# Patient Record
Sex: Male | Born: 1935 | Race: White | Hispanic: No
Health system: Southern US, Community
[De-identification: ages and names within clinical notes are randomized; demographics above are authoritative.]

---

## 2017-08-31 ENCOUNTER — Other Ambulatory Visit: Payer: Self-pay | Admitting: Physician Assistant

## 2017-08-31 DIAGNOSIS — M5136 Other intervertebral disc degeneration, lumbar region: Secondary | ICD-10-CM

## 2017-08-31 DIAGNOSIS — M48061 Spinal stenosis, lumbar region without neurogenic claudication: Secondary | ICD-10-CM

## 2017-09-06 ENCOUNTER — Encounter (HOSPITAL_COMMUNITY): Payer: Self-pay

## 2017-09-06 ENCOUNTER — Ambulatory Visit (HOSPITAL_COMMUNITY): Payer: Self-pay

## 2017-09-11 ENCOUNTER — Ambulatory Visit
Admission: RE | Admit: 2017-09-11 | Discharge: 2017-09-11 | Disposition: A | Discharge status: Home / Self Care | Payer: Medicare PPO | Source: Ambulatory Visit | Attending: Physician Assistant | Admitting: Physician Assistant

## 2017-09-11 DIAGNOSIS — M5136 Other intervertebral disc degeneration, lumbar region: Secondary | ICD-10-CM

## 2017-09-11 DIAGNOSIS — M48061 Spinal stenosis, lumbar region without neurogenic claudication: Secondary | ICD-10-CM

## 2019-01-11 DIAGNOSIS — R232 Flushing: Secondary | ICD-10-CM | POA: Diagnosis not present

## 2019-01-11 DIAGNOSIS — C61 Malignant neoplasm of prostate: Secondary | ICD-10-CM | POA: Diagnosis not present

## 2019-01-11 DIAGNOSIS — E559 Vitamin D deficiency, unspecified: Secondary | ICD-10-CM | POA: Diagnosis not present

## 2019-04-12 DIAGNOSIS — M818 Other osteoporosis without current pathological fracture: Secondary | ICD-10-CM | POA: Diagnosis not present

## 2019-04-12 DIAGNOSIS — E559 Vitamin D deficiency, unspecified: Secondary | ICD-10-CM | POA: Diagnosis not present

## 2019-04-12 DIAGNOSIS — C61 Malignant neoplasm of prostate: Secondary | ICD-10-CM | POA: Diagnosis not present

## 2019-05-05 DIAGNOSIS — Z23 Encounter for immunization: Secondary | ICD-10-CM | POA: Diagnosis not present

## 2019-05-31 DIAGNOSIS — Z0000 Encounter for general adult medical examination without abnormal findings: Secondary | ICD-10-CM | POA: Diagnosis not present

## 2019-05-31 DIAGNOSIS — E785 Hyperlipidemia, unspecified: Secondary | ICD-10-CM | POA: Diagnosis not present

## 2019-05-31 DIAGNOSIS — Z8546 Personal history of malignant neoplasm of prostate: Secondary | ICD-10-CM | POA: Diagnosis not present

## 2019-05-31 DIAGNOSIS — E039 Hypothyroidism, unspecified: Secondary | ICD-10-CM | POA: Diagnosis not present

## 2019-07-12 DIAGNOSIS — M818 Other osteoporosis without current pathological fracture: Secondary | ICD-10-CM | POA: Diagnosis not present

## 2019-07-12 DIAGNOSIS — E559 Vitamin D deficiency, unspecified: Secondary | ICD-10-CM | POA: Diagnosis not present

## 2019-07-12 DIAGNOSIS — C61 Malignant neoplasm of prostate: Secondary | ICD-10-CM | POA: Diagnosis not present

## 2019-09-05 ENCOUNTER — Telehealth: Payer: Self-pay

## 2019-09-05 NOTE — Telephone Encounter (Signed)
Contacted pt; explained that appts could not be traded; pt transferred to [facility] Agent Nelva Bush to place his wife on wait list.

## 2019-09-05 NOTE — Telephone Encounter (Signed)
Pt called to verify his app for covid vaccine. His wife does not yet have an app and he would like to give his spot to his wife because he feels that she needs it more than he does. Will have nurse to call him back.   Joycelyn Rua Hopkins

## 2019-09-07 ENCOUNTER — Ambulatory Visit: Payer: Medicare PPO | Attending: Internal Medicine

## 2019-09-07 DIAGNOSIS — Z23 Encounter for immunization: Secondary | ICD-10-CM | POA: Insufficient documentation

## 2019-09-07 NOTE — Progress Notes (Signed)
   Covid-19 Vaccination Clinic  Name:  Eric Riley    MRN: [id number] DOB: [birth year]  09/07/2019  Eric Riley was observed post Covid-19 immunization for 15 minutes without incidence. He was provided with Vaccine Information Sheet and instruction to access the V-Safe system.   Eric Riley was instructed to call 911 with any severe reactions post vaccine: Marland Kitchen Difficulty breathing  . Swelling of your face and throat  . A fast heartbeat  . A bad rash all over your body  . Dizziness and weakness    Immunizations Administered    Name Date Dose VIS Date Route   Pfizer COVID-19 Vaccine 09/07/2019  4:06 PM 0.3 mL 07/07/2019 Intramuscular   Manufacturer: [employer], [employer]   Lot: [id number]   NDC: [address]-1

## 2019-10-02 ENCOUNTER — Ambulatory Visit: Payer: Medicare PPO | Attending: Internal Medicine

## 2019-10-02 DIAGNOSIS — Z23 Encounter for immunization: Secondary | ICD-10-CM | POA: Insufficient documentation

## 2019-10-02 NOTE — Progress Notes (Signed)
   Covid-19 Vaccination Clinic  Name:  CHRISEAN KLOTH    MRN: [id number] DOB: 07-25-1936  10/02/2019  Mr. Franca was observed post Covid-19 immunization for 15 minutes without incident. He was provided with Vaccine Information Sheet and instruction to access the V-Safe system.   Mr. Mazzuca was instructed to call 911 with any severe reactions post vaccine: Marland Kitchen Difficulty breathing  . Swelling of face and throat  . A fast heartbeat  . A bad rash all over body  . Dizziness and weakness   Immunizations Administered    Name Date Dose VIS Date Route   Pfizer COVID-19 Vaccine 10/02/2019 12:37 PM 0.3 mL 07/07/2019 Intramuscular   Manufacturer: [employer], [employer]   Lot: [id number]   NDC: [address]-0

## 2019-10-11 DIAGNOSIS — C7951 Secondary malignant neoplasm of bone: Secondary | ICD-10-CM | POA: Diagnosis not present

## 2019-10-11 DIAGNOSIS — Z192 Hormone resistant malignancy status: Secondary | ICD-10-CM | POA: Diagnosis not present

## 2019-10-11 DIAGNOSIS — T387X5A Adverse effect of androgens and anabolic congeners, initial encounter: Secondary | ICD-10-CM | POA: Diagnosis not present

## 2019-10-11 DIAGNOSIS — M818 Other osteoporosis without current pathological fracture: Secondary | ICD-10-CM | POA: Diagnosis not present

## 2019-10-11 DIAGNOSIS — C61 Malignant neoplasm of prostate: Secondary | ICD-10-CM | POA: Diagnosis not present

## 2019-10-11 DIAGNOSIS — Z9079 Acquired absence of other genital organ(s): Secondary | ICD-10-CM | POA: Diagnosis not present

## 2019-10-11 DIAGNOSIS — E559 Vitamin D deficiency, unspecified: Secondary | ICD-10-CM | POA: Diagnosis not present

## 2019-10-11 DIAGNOSIS — E876 Hypokalemia: Secondary | ICD-10-CM | POA: Diagnosis not present

## 2019-10-17 DIAGNOSIS — M5430 Sciatica, unspecified side: Secondary | ICD-10-CM | POA: Diagnosis not present

## 2019-10-17 DIAGNOSIS — R29898 Other symptoms and signs involving the musculoskeletal system: Secondary | ICD-10-CM | POA: Diagnosis not present

## 2019-10-17 DIAGNOSIS — I10 Essential (primary) hypertension: Secondary | ICD-10-CM | POA: Diagnosis not present

## 2019-10-20 ENCOUNTER — Other Ambulatory Visit: Payer: Self-pay | Admitting: Family Medicine

## 2019-10-20 DIAGNOSIS — M5430 Sciatica, unspecified side: Secondary | ICD-10-CM

## 2019-11-09 ENCOUNTER — Ambulatory Visit
Admission: RE | Admit: 2019-11-09 | Discharge: 2019-11-09 | Disposition: A | Discharge status: Home / Self Care | Payer: Medicare PPO | Source: Ambulatory Visit | Attending: Family Medicine | Admitting: Family Medicine

## 2019-11-09 DIAGNOSIS — M47816 Spondylosis without myelopathy or radiculopathy, lumbar region: Secondary | ICD-10-CM | POA: Diagnosis not present

## 2019-11-09 DIAGNOSIS — M5430 Sciatica, unspecified side: Secondary | ICD-10-CM

## 2019-11-09 MED ORDER — GADOBENATE DIMEGLUMINE 529 MG/ML IV SOLN
15.0000 mL | Freq: Once | INTRAVENOUS | Status: AC | PRN
  Administered 2019-11-09: 15 mL via INTRAVENOUS

## 2019-12-06 DIAGNOSIS — C61 Malignant neoplasm of prostate: Secondary | ICD-10-CM | POA: Diagnosis not present

## 2019-12-06 DIAGNOSIS — E039 Hypothyroidism, unspecified: Secondary | ICD-10-CM | POA: Diagnosis not present

## 2019-12-06 DIAGNOSIS — M8580 Other specified disorders of bone density and structure, unspecified site: Secondary | ICD-10-CM | POA: Diagnosis not present

## 2019-12-06 DIAGNOSIS — I10 Essential (primary) hypertension: Secondary | ICD-10-CM | POA: Diagnosis not present

## 2019-12-06 DIAGNOSIS — M5430 Sciatica, unspecified side: Secondary | ICD-10-CM | POA: Diagnosis not present

## 2020-01-02 DIAGNOSIS — M48062 Spinal stenosis, lumbar region with neurogenic claudication: Secondary | ICD-10-CM | POA: Diagnosis not present

## 2020-01-02 DIAGNOSIS — Z1159 Encounter for screening for other viral diseases: Secondary | ICD-10-CM | POA: Diagnosis not present

## 2020-01-02 DIAGNOSIS — Z9889 Other specified postprocedural states: Secondary | ICD-10-CM | POA: Diagnosis not present

## 2020-01-02 DIAGNOSIS — E039 Hypothyroidism, unspecified: Secondary | ICD-10-CM | POA: Diagnosis not present

## 2020-01-02 DIAGNOSIS — I959 Hypotension, unspecified: Secondary | ICD-10-CM | POA: Diagnosis not present

## 2020-01-02 DIAGNOSIS — M545 Low back pain: Secondary | ICD-10-CM | POA: Diagnosis not present

## 2020-01-02 DIAGNOSIS — M4316 Spondylolisthesis, lumbar region: Secondary | ICD-10-CM | POA: Diagnosis not present

## 2020-01-02 DIAGNOSIS — M48061 Spinal stenosis, lumbar region without neurogenic claudication: Secondary | ICD-10-CM | POA: Diagnosis not present

## 2020-01-02 DIAGNOSIS — M5137 Other intervertebral disc degeneration, lumbosacral region: Secondary | ICD-10-CM | POA: Diagnosis not present

## 2020-01-02 DIAGNOSIS — E559 Vitamin D deficiency, unspecified: Secondary | ICD-10-CM | POA: Diagnosis not present

## 2020-01-02 DIAGNOSIS — C61 Malignant neoplasm of prostate: Secondary | ICD-10-CM | POA: Diagnosis not present

## 2020-01-02 DIAGNOSIS — M47816 Spondylosis without myelopathy or radiculopathy, lumbar region: Secondary | ICD-10-CM | POA: Diagnosis not present

## 2020-01-05 DIAGNOSIS — I959 Hypotension, unspecified: Secondary | ICD-10-CM | POA: Diagnosis not present

## 2020-01-05 DIAGNOSIS — E039 Hypothyroidism, unspecified: Secondary | ICD-10-CM | POA: Diagnosis not present

## 2020-01-29 DIAGNOSIS — Z20822 Contact with and (suspected) exposure to covid-19: Secondary | ICD-10-CM | POA: Diagnosis not present

## 2020-01-31 DIAGNOSIS — M8580 Other specified disorders of bone density and structure, unspecified site: Secondary | ICD-10-CM | POA: Diagnosis not present

## 2020-01-31 DIAGNOSIS — Z79899 Other long term (current) drug therapy: Secondary | ICD-10-CM | POA: Diagnosis not present

## 2020-01-31 DIAGNOSIS — G9741 Accidental puncture or laceration of dura during a procedure: Secondary | ICD-10-CM | POA: Diagnosis not present

## 2020-01-31 DIAGNOSIS — Z596 Low income: Secondary | ICD-10-CM | POA: Diagnosis not present

## 2020-01-31 DIAGNOSIS — Z9079 Acquired absence of other genital organ(s): Secondary | ICD-10-CM | POA: Diagnosis not present

## 2020-01-31 DIAGNOSIS — E876 Hypokalemia: Secondary | ICD-10-CM | POA: Diagnosis not present

## 2020-01-31 DIAGNOSIS — M48061 Spinal stenosis, lumbar region without neurogenic claudication: Secondary | ICD-10-CM | POA: Diagnosis not present

## 2020-01-31 DIAGNOSIS — M48062 Spinal stenosis, lumbar region with neurogenic claudication: Secondary | ICD-10-CM | POA: Diagnosis not present

## 2020-01-31 DIAGNOSIS — Z8546 Personal history of malignant neoplasm of prostate: Secondary | ICD-10-CM | POA: Diagnosis not present

## 2020-01-31 DIAGNOSIS — E039 Hypothyroidism, unspecified: Secondary | ICD-10-CM | POA: Diagnosis not present

## 2020-01-31 DIAGNOSIS — Z7952 Long term (current) use of systemic steroids: Secondary | ICD-10-CM | POA: Diagnosis not present

## 2020-02-01 DIAGNOSIS — Z79899 Other long term (current) drug therapy: Secondary | ICD-10-CM | POA: Diagnosis not present

## 2020-02-01 DIAGNOSIS — E039 Hypothyroidism, unspecified: Secondary | ICD-10-CM | POA: Diagnosis not present

## 2020-02-01 DIAGNOSIS — M48062 Spinal stenosis, lumbar region with neurogenic claudication: Secondary | ICD-10-CM | POA: Diagnosis not present

## 2020-02-01 DIAGNOSIS — Z7952 Long term (current) use of systemic steroids: Secondary | ICD-10-CM | POA: Diagnosis not present

## 2020-02-01 DIAGNOSIS — M8580 Other specified disorders of bone density and structure, unspecified site: Secondary | ICD-10-CM | POA: Diagnosis not present

## 2020-02-01 DIAGNOSIS — Z596 Low income: Secondary | ICD-10-CM | POA: Diagnosis not present

## 2020-02-01 DIAGNOSIS — Z8546 Personal history of malignant neoplasm of prostate: Secondary | ICD-10-CM | POA: Diagnosis not present

## 2020-02-01 DIAGNOSIS — Z9079 Acquired absence of other genital organ(s): Secondary | ICD-10-CM | POA: Diagnosis not present

## 2020-02-01 DIAGNOSIS — E876 Hypokalemia: Secondary | ICD-10-CM | POA: Diagnosis not present

## 2020-03-06 DIAGNOSIS — E039 Hypothyroidism, unspecified: Secondary | ICD-10-CM | POA: Diagnosis not present

## 2020-04-03 DIAGNOSIS — E039 Hypothyroidism, unspecified: Secondary | ICD-10-CM | POA: Diagnosis not present

## 2020-04-03 DIAGNOSIS — E876 Hypokalemia: Secondary | ICD-10-CM | POA: Diagnosis not present

## 2020-04-03 DIAGNOSIS — C7951 Secondary malignant neoplasm of bone: Secondary | ICD-10-CM | POA: Diagnosis not present

## 2020-04-03 DIAGNOSIS — M818 Other osteoporosis without current pathological fracture: Secondary | ICD-10-CM | POA: Diagnosis not present

## 2020-04-03 DIAGNOSIS — T387X5A Adverse effect of androgens and anabolic congeners, initial encounter: Secondary | ICD-10-CM | POA: Diagnosis not present

## 2020-04-03 DIAGNOSIS — C61 Malignant neoplasm of prostate: Secondary | ICD-10-CM | POA: Diagnosis not present

## 2020-04-03 DIAGNOSIS — E559 Vitamin D deficiency, unspecified: Secondary | ICD-10-CM | POA: Diagnosis not present

## 2020-04-03 DIAGNOSIS — Z5112 Encounter for antineoplastic immunotherapy: Secondary | ICD-10-CM | POA: Diagnosis not present

## 2020-04-03 DIAGNOSIS — Z191 Hormone sensitive malignancy status: Secondary | ICD-10-CM | POA: Diagnosis not present

## 2020-04-25 DIAGNOSIS — Z23 Encounter for immunization: Secondary | ICD-10-CM | POA: Diagnosis not present

## 2020-06-28 IMAGING — MR MR LUMBAR SPINE WO/W CM
4 of 7 series · 26 of 48 positions shown · IV contrast (Multihance)
Comparison: 09/11/2017

CLINICAL DATA: Low back pain with right-sided radiculopathy.
History of prostate cancer.

EXAM:
MRI LUMBAR SPINE WITHOUT AND WITH CONTRAST
TECHNIQUE: Multiplanar and multiecho pulse sequences of the lumbar spine were
obtained without and with intravenous contrast.
CONTRAST:  15mL MULTIHANCE GADOBENATE DIMEGLUMINE 529 MG/ML IV SOLN

[Series 2: T2 · sagittal · 4.0mm · 0.59mm/px · 3 of 16 slices shown (1 of 2)]
[im 1/16]
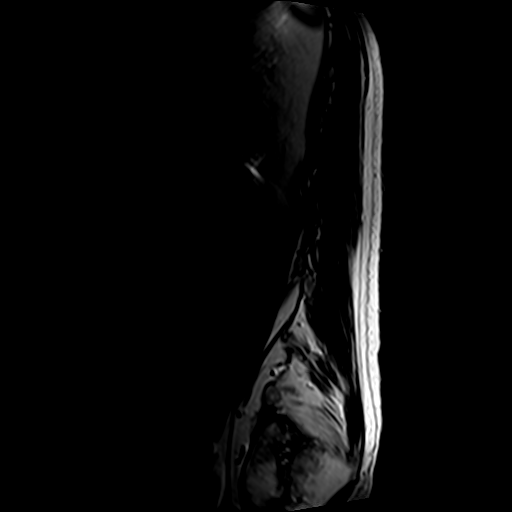
[im 8/16]
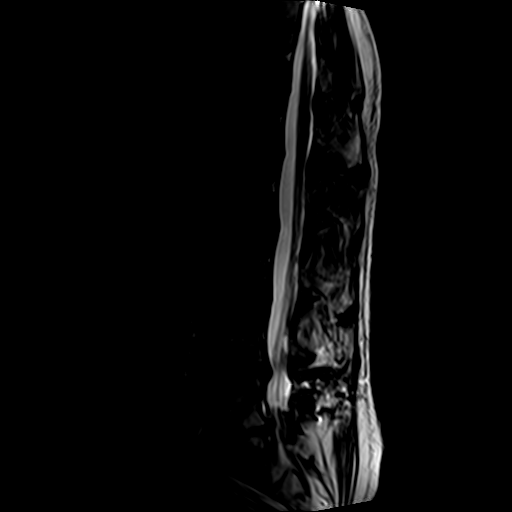
[im 16/16]
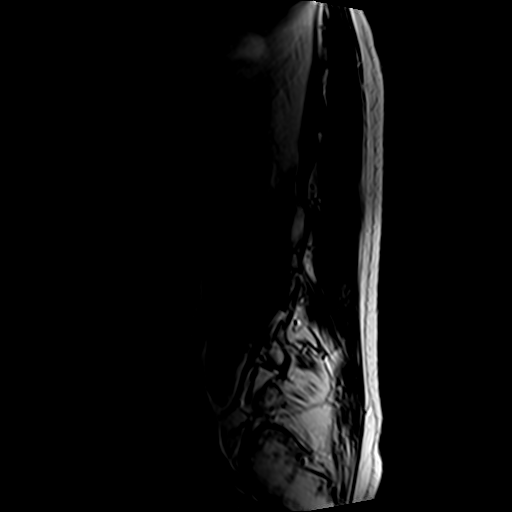

[Series 4: T1 · sagittal · 4.0mm · 0.59mm/px · 4 of 16 slices shown (1 of 2)]
[im 1/16]
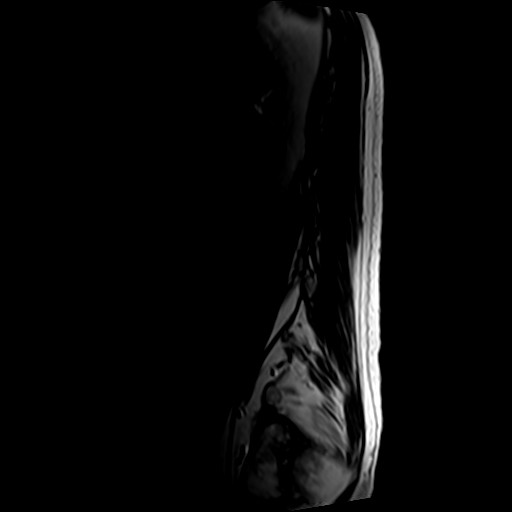
[im 6/16]
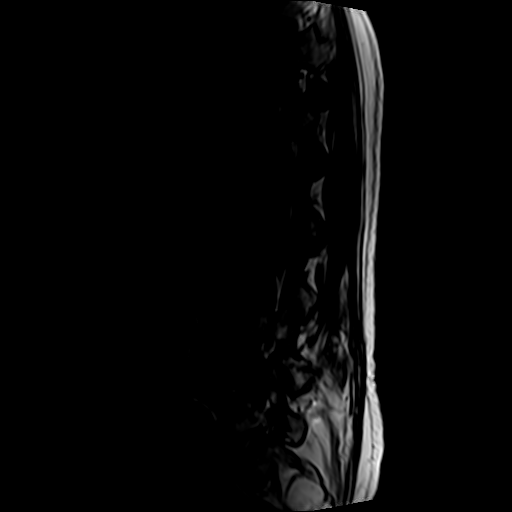
[im 11/16]
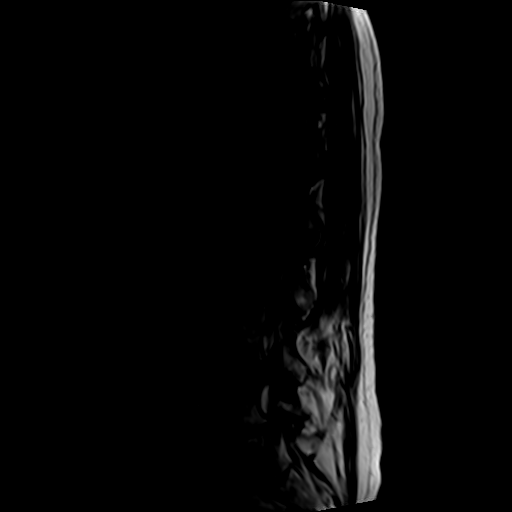
[im 16/16]
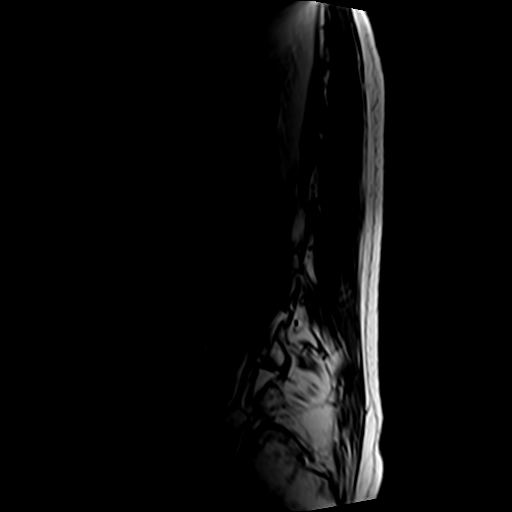

[Series 5: T2 · axial · 4.0mm · 0.70mm/px · z∈[-37,+205]mm · 11 of 43 slices shown (2 of 2)]
[im 1/43]
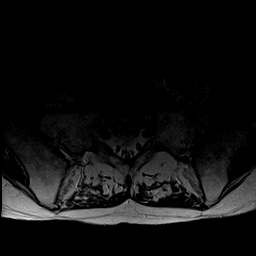
[im 5/43]
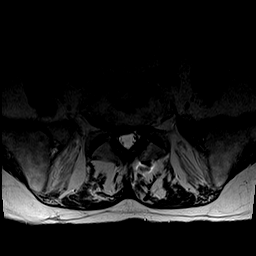
[im 9/43]
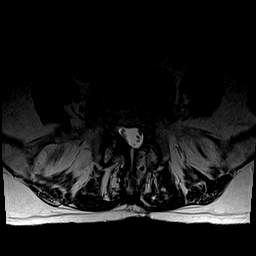
[im 13/43]
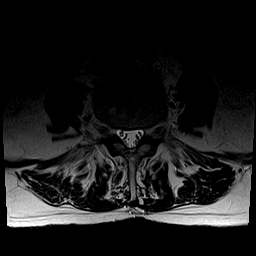
[im 17/43]
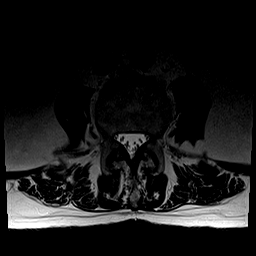
[im 22/43]
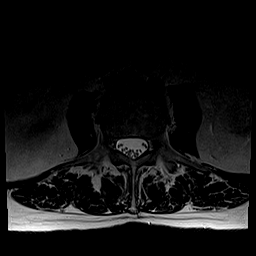
[im 26/43]
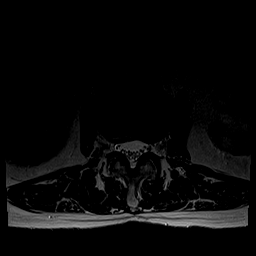
[im 30/43]
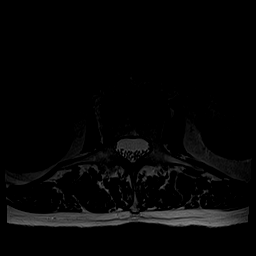
[im 34/43]
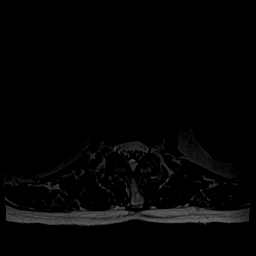
[im 38/43]
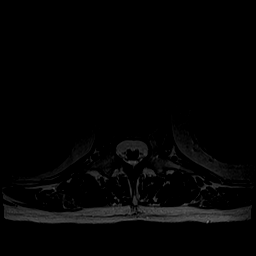
[im 43/43]
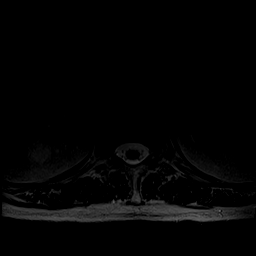

[Series 6: T1 · axial · 4.0mm · 0.35mm/px · z∈[-37,+179]mm · 8 of 43 slices shown (2 of 2)]
[im 1/43]
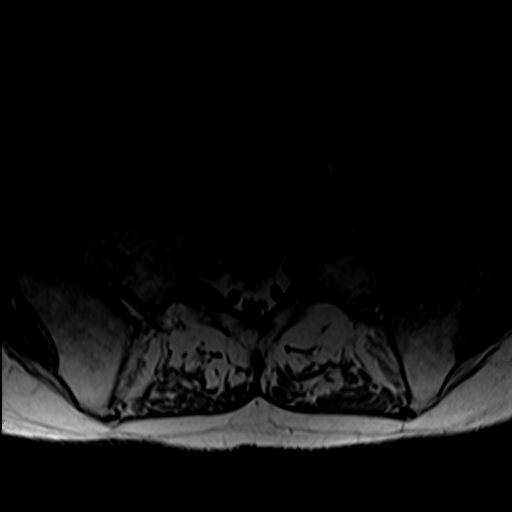
[im 5/43]
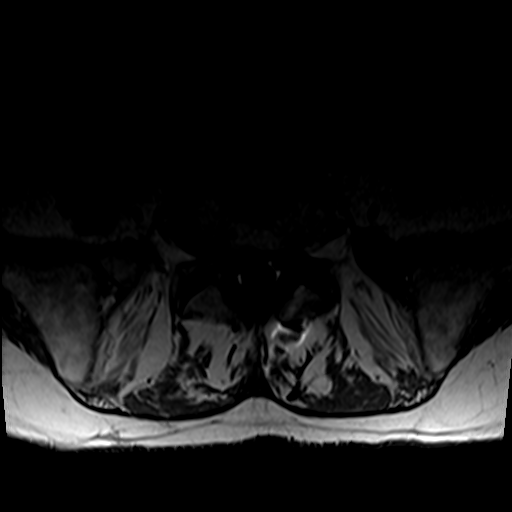
[im 9/43]
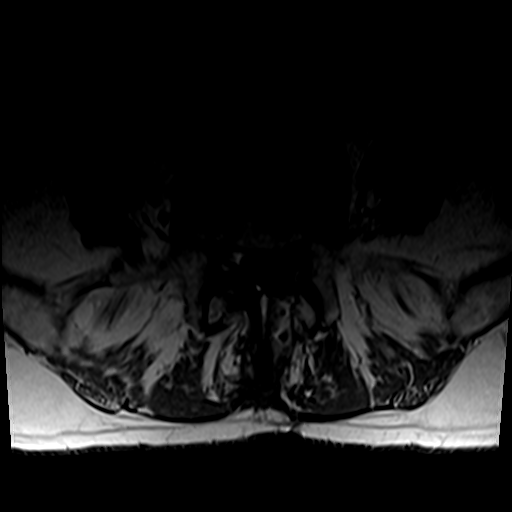
[im 13/43]
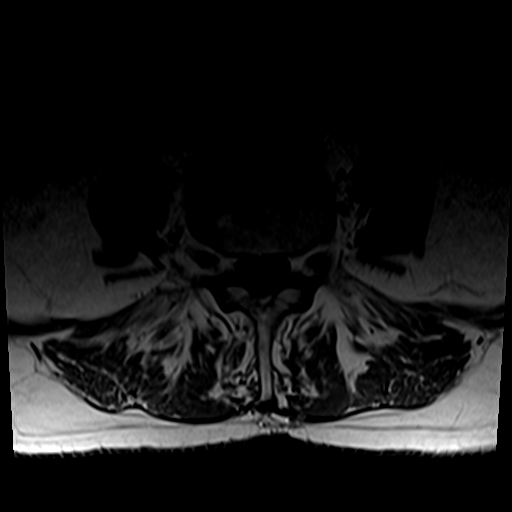
[im 17/43]
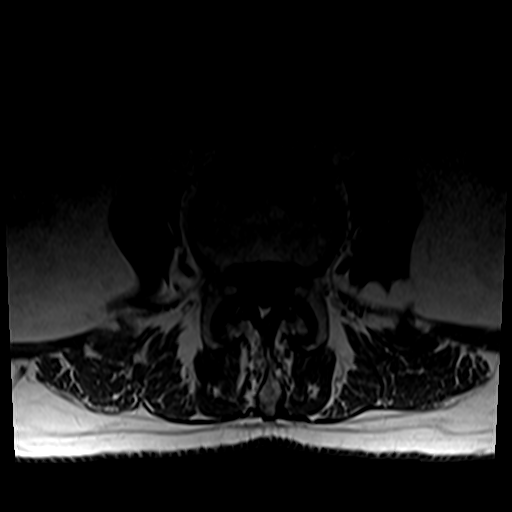
[im 22/43]
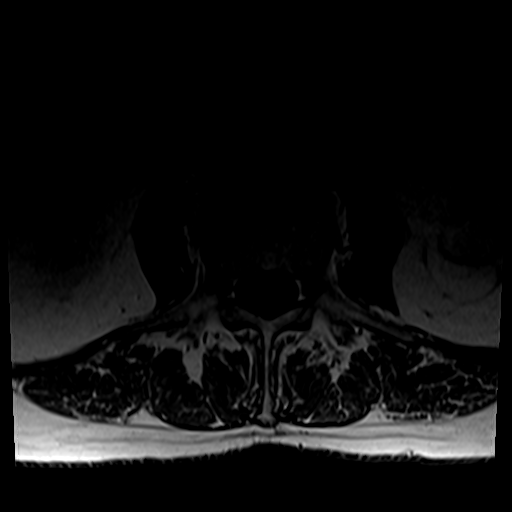
[im 26/43]
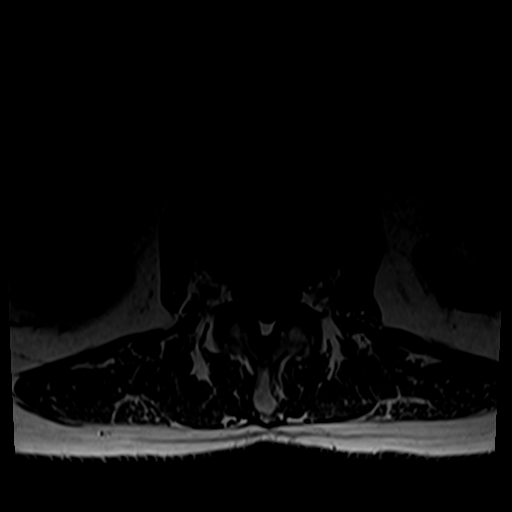
[im 38/43]
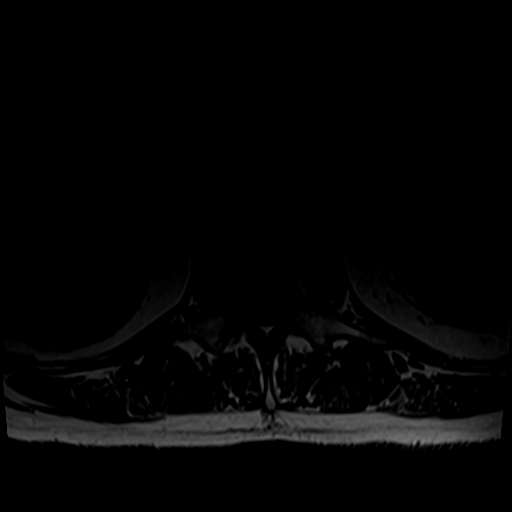

[26 of 48 positions shown; findings below may reference images not displayed]

FINDINGS: Segmentation:  Standard.

Alignment:  Trace retrolisthesis L5 on S1.

Vertebrae: No fracture, evidence of discitis, or suspicious marrow
replacing bone lesion.

Conus medullaris and cauda equina: Conus extends to the L1-2 level.
Conus and cauda equina appear normal.

Paraspinal and other soft tissues: Cortically based T2 hyperintense
lesionswithin the bilateral kidneys, incompletely characterized, but
most likely represent cysts.

Disc levels:

T12-L1: No significant disc protrusion, foraminal stenosis, or canal
stenosis.

L1-L2: Small right paracentral disc protrusion. No foraminal or
canal stenosis. Unchanged.

L2-L3: Small right paracentral disc protrusion. Mild bilateral facet
arthropathy. No foraminal or canal stenosis. Unchanged.

L3-L4: Minimal disc bulge. Bilateral facet arthropathy. No foraminal
or canal stenosis. Unchanged.

L4-L5: Prior left laminectomy. Diffuse disc bulge with bilateral
facet arthropathy result in moderate canal stenosis and moderate to
severe bilateral subarticular recess stenosis. Bilateral foramina
remain patent. Previously seen left subarticular facet synovial cyst
is no longer seen. The remaining findings at this level are
unchanged from prior.

L5-S1: Advanced disc height loss with disc bulge and osteophytic
endplate spurring. Mild bilateral facet arthrosis. Moderate to
severe right subarticular recess stenosis. Mild left subarticular
recess stenosis. No canal or foraminal stenosis. Unchanged.
IMPRESSION: 1. No significant interval progression of multilevel lumbar
spondylosis. Findings most pronounced at L4-L5 with moderate canal
stenosis and moderate-to-severe bilateral subarticular recess
stenosis impinging the bilateral L5 nerve roots. Previously seen
left L4-5 subarticular facet synovial cyst is no longer seen.
2. Similar moderate-to-severe right subarticular recess stenosis at
L5-S1, likely impacting the right S1 nerve root.

## 2020-07-10 DIAGNOSIS — E559 Vitamin D deficiency, unspecified: Secondary | ICD-10-CM | POA: Diagnosis not present

## 2020-07-10 DIAGNOSIS — C61 Malignant neoplasm of prostate: Secondary | ICD-10-CM | POA: Diagnosis not present

## 2020-09-17 DIAGNOSIS — C61 Malignant neoplasm of prostate: Secondary | ICD-10-CM | POA: Diagnosis not present

## 2020-09-30 DIAGNOSIS — R972 Elevated prostate specific antigen [PSA]: Secondary | ICD-10-CM | POA: Diagnosis not present

## 2020-09-30 DIAGNOSIS — N320 Bladder-neck obstruction: Secondary | ICD-10-CM | POA: Diagnosis not present

## 2020-09-30 DIAGNOSIS — C7951 Secondary malignant neoplasm of bone: Secondary | ICD-10-CM | POA: Diagnosis not present

## 2020-09-30 DIAGNOSIS — Z5112 Encounter for antineoplastic immunotherapy: Secondary | ICD-10-CM | POA: Diagnosis not present

## 2020-09-30 DIAGNOSIS — Q873 Congenital malformation syndromes involving early overgrowth: Secondary | ICD-10-CM | POA: Diagnosis not present

## 2020-09-30 DIAGNOSIS — C61 Malignant neoplasm of prostate: Secondary | ICD-10-CM | POA: Diagnosis not present

## 2020-09-30 DIAGNOSIS — R030 Elevated blood-pressure reading, without diagnosis of hypertension: Secondary | ICD-10-CM | POA: Diagnosis not present

## 2020-09-30 DIAGNOSIS — E876 Hypokalemia: Secondary | ICD-10-CM | POA: Diagnosis not present

## 2020-09-30 DIAGNOSIS — M810 Age-related osteoporosis without current pathological fracture: Secondary | ICD-10-CM | POA: Diagnosis not present

## 2020-10-09 DIAGNOSIS — Z79899 Other long term (current) drug therapy: Secondary | ICD-10-CM | POA: Diagnosis not present

## 2020-10-09 DIAGNOSIS — M810 Age-related osteoporosis without current pathological fracture: Secondary | ICD-10-CM | POA: Diagnosis not present

## 2020-10-09 DIAGNOSIS — C61 Malignant neoplasm of prostate: Secondary | ICD-10-CM | POA: Diagnosis not present

## 2020-10-09 DIAGNOSIS — Z8042 Family history of malignant neoplasm of prostate: Secondary | ICD-10-CM | POA: Diagnosis not present

## 2020-10-09 DIAGNOSIS — Z800 Family history of malignant neoplasm of digestive organs: Secondary | ICD-10-CM | POA: Diagnosis not present

## 2020-10-09 DIAGNOSIS — M818 Other osteoporosis without current pathological fracture: Secondary | ICD-10-CM | POA: Diagnosis not present

## 2020-10-09 DIAGNOSIS — T387X5A Adverse effect of androgens and anabolic congeners, initial encounter: Secondary | ICD-10-CM | POA: Diagnosis not present

## 2020-10-09 DIAGNOSIS — Z9079 Acquired absence of other genital organ(s): Secondary | ICD-10-CM | POA: Diagnosis not present

## 2020-10-09 DIAGNOSIS — Z7952 Long term (current) use of systemic steroids: Secondary | ICD-10-CM | POA: Diagnosis not present

## 2020-10-09 DIAGNOSIS — Z791 Long term (current) use of non-steroidal anti-inflammatories (NSAID): Secondary | ICD-10-CM | POA: Diagnosis not present

## 2020-10-09 DIAGNOSIS — E876 Hypokalemia: Secondary | ICD-10-CM | POA: Diagnosis not present

## 2020-10-14 DIAGNOSIS — Z79899 Other long term (current) drug therapy: Secondary | ICD-10-CM | POA: Diagnosis not present

## 2020-10-14 DIAGNOSIS — C61 Malignant neoplasm of prostate: Secondary | ICD-10-CM | POA: Diagnosis not present

## 2020-10-14 DIAGNOSIS — C7951 Secondary malignant neoplasm of bone: Secondary | ICD-10-CM | POA: Diagnosis not present

## 2020-11-14 DIAGNOSIS — Z79899 Other long term (current) drug therapy: Secondary | ICD-10-CM | POA: Diagnosis not present

## 2020-11-14 DIAGNOSIS — C61 Malignant neoplasm of prostate: Secondary | ICD-10-CM | POA: Diagnosis not present

## 2020-11-14 DIAGNOSIS — R030 Elevated blood-pressure reading, without diagnosis of hypertension: Secondary | ICD-10-CM | POA: Diagnosis not present

## 2020-11-14 DIAGNOSIS — R5381 Other malaise: Secondary | ICD-10-CM | POA: Diagnosis not present

## 2020-11-14 DIAGNOSIS — C7951 Secondary malignant neoplasm of bone: Secondary | ICD-10-CM | POA: Diagnosis not present

## 2020-11-14 DIAGNOSIS — M810 Age-related osteoporosis without current pathological fracture: Secondary | ICD-10-CM | POA: Diagnosis not present

## 2020-11-14 DIAGNOSIS — R519 Headache, unspecified: Secondary | ICD-10-CM | POA: Diagnosis not present

## 2020-11-14 DIAGNOSIS — R5383 Other fatigue: Secondary | ICD-10-CM | POA: Diagnosis not present

## 2020-11-14 DIAGNOSIS — E876 Hypokalemia: Secondary | ICD-10-CM | POA: Diagnosis not present

## 2020-11-14 DIAGNOSIS — M7989 Other specified soft tissue disorders: Secondary | ICD-10-CM | POA: Diagnosis not present

## 2020-11-14 DIAGNOSIS — Z79818 Long term (current) use of other agents affecting estrogen receptors and estrogen levels: Secondary | ICD-10-CM | POA: Diagnosis not present

## 2021-01-08 DIAGNOSIS — E039 Hypothyroidism, unspecified: Secondary | ICD-10-CM | POA: Diagnosis not present

## 2021-01-08 DIAGNOSIS — R310 Gross hematuria: Secondary | ICD-10-CM | POA: Diagnosis not present

## 2021-01-08 DIAGNOSIS — C61 Malignant neoplasm of prostate: Secondary | ICD-10-CM | POA: Diagnosis not present

## 2021-01-08 DIAGNOSIS — E559 Vitamin D deficiency, unspecified: Secondary | ICD-10-CM | POA: Diagnosis not present

## 2021-02-01 DIAGNOSIS — C61 Malignant neoplasm of prostate: Secondary | ICD-10-CM | POA: Diagnosis not present

## 2021-02-13 DIAGNOSIS — C7951 Secondary malignant neoplasm of bone: Secondary | ICD-10-CM | POA: Diagnosis not present

## 2021-02-13 DIAGNOSIS — R399 Unspecified symptoms and signs involving the genitourinary system: Secondary | ICD-10-CM | POA: Diagnosis not present

## 2021-02-13 DIAGNOSIS — R319 Hematuria, unspecified: Secondary | ICD-10-CM | POA: Diagnosis not present

## 2021-02-13 DIAGNOSIS — C61 Malignant neoplasm of prostate: Secondary | ICD-10-CM | POA: Diagnosis not present

## 2021-03-28 DIAGNOSIS — Z0000 Encounter for general adult medical examination without abnormal findings: Secondary | ICD-10-CM | POA: Diagnosis not present

## 2021-03-28 DIAGNOSIS — C61 Malignant neoplasm of prostate: Secondary | ICD-10-CM | POA: Diagnosis not present

## 2021-03-28 DIAGNOSIS — I959 Hypotension, unspecified: Secondary | ICD-10-CM | POA: Diagnosis not present

## 2021-03-28 DIAGNOSIS — E785 Hyperlipidemia, unspecified: Secondary | ICD-10-CM | POA: Diagnosis not present

## 2021-03-28 DIAGNOSIS — D649 Anemia, unspecified: Secondary | ICD-10-CM | POA: Diagnosis not present

## 2021-03-28 DIAGNOSIS — E039 Hypothyroidism, unspecified: Secondary | ICD-10-CM | POA: Diagnosis not present

## 2021-04-09 DIAGNOSIS — E559 Vitamin D deficiency, unspecified: Secondary | ICD-10-CM | POA: Diagnosis not present

## 2021-04-09 DIAGNOSIS — T387X5A Adverse effect of androgens and anabolic congeners, initial encounter: Secondary | ICD-10-CM | POA: Diagnosis not present

## 2021-04-09 DIAGNOSIS — C61 Malignant neoplasm of prostate: Secondary | ICD-10-CM | POA: Diagnosis not present

## 2021-04-09 DIAGNOSIS — M818 Other osteoporosis without current pathological fracture: Secondary | ICD-10-CM | POA: Diagnosis not present

## 2021-05-14 DIAGNOSIS — C61 Malignant neoplasm of prostate: Secondary | ICD-10-CM | POA: Diagnosis not present

## 2021-05-14 DIAGNOSIS — R399 Unspecified symptoms and signs involving the genitourinary system: Secondary | ICD-10-CM | POA: Diagnosis not present

## 2021-05-14 DIAGNOSIS — Z20822 Contact with and (suspected) exposure to covid-19: Secondary | ICD-10-CM | POA: Diagnosis not present

## 2021-05-14 DIAGNOSIS — C7951 Secondary malignant neoplasm of bone: Secondary | ICD-10-CM | POA: Diagnosis not present

## 2021-05-16 DIAGNOSIS — N401 Enlarged prostate with lower urinary tract symptoms: Secondary | ICD-10-CM | POA: Diagnosis not present

## 2021-05-16 DIAGNOSIS — C61 Malignant neoplasm of prostate: Secondary | ICD-10-CM | POA: Diagnosis not present

## 2021-05-16 DIAGNOSIS — Z882 Allergy status to sulfonamides status: Secondary | ICD-10-CM | POA: Diagnosis not present

## 2021-05-16 DIAGNOSIS — N138 Other obstructive and reflux uropathy: Secondary | ICD-10-CM | POA: Diagnosis not present

## 2021-05-16 DIAGNOSIS — N400 Enlarged prostate without lower urinary tract symptoms: Secondary | ICD-10-CM | POA: Diagnosis not present

## 2021-05-16 DIAGNOSIS — N402 Nodular prostate without lower urinary tract symptoms: Secondary | ICD-10-CM | POA: Diagnosis not present

## 2021-05-16 DIAGNOSIS — R338 Other retention of urine: Secondary | ICD-10-CM | POA: Diagnosis not present

## 2021-05-16 DIAGNOSIS — Z79899 Other long term (current) drug therapy: Secondary | ICD-10-CM | POA: Diagnosis not present

## 2021-05-17 DIAGNOSIS — C61 Malignant neoplasm of prostate: Secondary | ICD-10-CM | POA: Diagnosis not present

## 2021-05-17 DIAGNOSIS — R338 Other retention of urine: Secondary | ICD-10-CM | POA: Diagnosis not present

## 2021-05-17 DIAGNOSIS — Z882 Allergy status to sulfonamides status: Secondary | ICD-10-CM | POA: Diagnosis not present

## 2021-05-17 DIAGNOSIS — N138 Other obstructive and reflux uropathy: Secondary | ICD-10-CM | POA: Diagnosis not present

## 2021-05-17 DIAGNOSIS — N401 Enlarged prostate with lower urinary tract symptoms: Secondary | ICD-10-CM | POA: Diagnosis not present

## 2021-05-17 DIAGNOSIS — Z79899 Other long term (current) drug therapy: Secondary | ICD-10-CM | POA: Diagnosis not present

## 2021-05-26 DIAGNOSIS — C61 Malignant neoplasm of prostate: Secondary | ICD-10-CM | POA: Diagnosis not present

## 2021-07-07 DIAGNOSIS — E559 Vitamin D deficiency, unspecified: Secondary | ICD-10-CM | POA: Diagnosis not present

## 2021-07-07 DIAGNOSIS — N39498 Other specified urinary incontinence: Secondary | ICD-10-CM | POA: Diagnosis not present

## 2021-07-07 DIAGNOSIS — M818 Other osteoporosis without current pathological fracture: Secondary | ICD-10-CM | POA: Diagnosis not present

## 2021-07-07 DIAGNOSIS — C61 Malignant neoplasm of prostate: Secondary | ICD-10-CM | POA: Diagnosis not present

## 2021-07-07 DIAGNOSIS — T387X5A Adverse effect of androgens and anabolic congeners, initial encounter: Secondary | ICD-10-CM | POA: Diagnosis not present

## 2021-07-07 DIAGNOSIS — R338 Other retention of urine: Secondary | ICD-10-CM | POA: Diagnosis not present

## 2021-07-07 DIAGNOSIS — E876 Hypokalemia: Secondary | ICD-10-CM | POA: Diagnosis not present

## 2021-07-07 DIAGNOSIS — C7951 Secondary malignant neoplasm of bone: Secondary | ICD-10-CM | POA: Diagnosis not present

## 2021-07-07 DIAGNOSIS — Z9079 Acquired absence of other genital organ(s): Secondary | ICD-10-CM | POA: Diagnosis not present

## 2021-09-11 DIAGNOSIS — E042 Nontoxic multinodular goiter: Secondary | ICD-10-CM | POA: Diagnosis not present

## 2021-09-17 DIAGNOSIS — E042 Nontoxic multinodular goiter: Secondary | ICD-10-CM | POA: Diagnosis not present

## 2021-10-08 DIAGNOSIS — C61 Malignant neoplasm of prostate: Secondary | ICD-10-CM | POA: Diagnosis not present

## 2021-10-08 DIAGNOSIS — E559 Vitamin D deficiency, unspecified: Secondary | ICD-10-CM | POA: Diagnosis not present

## 2021-10-08 DIAGNOSIS — C7951 Secondary malignant neoplasm of bone: Secondary | ICD-10-CM | POA: Diagnosis not present

## 2022-01-14 DIAGNOSIS — Z9079 Acquired absence of other genital organ(s): Secondary | ICD-10-CM | POA: Diagnosis not present

## 2022-01-14 DIAGNOSIS — C7951 Secondary malignant neoplasm of bone: Secondary | ICD-10-CM | POA: Diagnosis not present

## 2022-01-14 DIAGNOSIS — E559 Vitamin D deficiency, unspecified: Secondary | ICD-10-CM | POA: Diagnosis not present

## 2022-01-14 DIAGNOSIS — C61 Malignant neoplasm of prostate: Secondary | ICD-10-CM | POA: Diagnosis not present

## 2022-01-14 DIAGNOSIS — E876 Hypokalemia: Secondary | ICD-10-CM | POA: Diagnosis not present

## 2022-01-14 DIAGNOSIS — Z8042 Family history of malignant neoplasm of prostate: Secondary | ICD-10-CM | POA: Diagnosis not present

## 2022-01-14 DIAGNOSIS — M810 Age-related osteoporosis without current pathological fracture: Secondary | ICD-10-CM | POA: Diagnosis not present

## 2022-01-14 DIAGNOSIS — R339 Retention of urine, unspecified: Secondary | ICD-10-CM | POA: Diagnosis not present

## 2022-04-01 DIAGNOSIS — E039 Hypothyroidism, unspecified: Secondary | ICD-10-CM | POA: Diagnosis not present

## 2022-04-01 DIAGNOSIS — M8580 Other specified disorders of bone density and structure, unspecified site: Secondary | ICD-10-CM | POA: Diagnosis not present

## 2022-04-01 DIAGNOSIS — D649 Anemia, unspecified: Secondary | ICD-10-CM | POA: Diagnosis not present

## 2022-04-01 DIAGNOSIS — E785 Hyperlipidemia, unspecified: Secondary | ICD-10-CM | POA: Diagnosis not present

## 2022-04-01 DIAGNOSIS — C61 Malignant neoplasm of prostate: Secondary | ICD-10-CM | POA: Diagnosis not present

## 2022-04-01 DIAGNOSIS — G629 Polyneuropathy, unspecified: Secondary | ICD-10-CM | POA: Diagnosis not present

## 2022-04-01 DIAGNOSIS — Z0000 Encounter for general adult medical examination without abnormal findings: Secondary | ICD-10-CM | POA: Diagnosis not present

## 2022-04-22 DIAGNOSIS — C61 Malignant neoplasm of prostate: Secondary | ICD-10-CM | POA: Diagnosis not present

## 2022-04-22 DIAGNOSIS — E559 Vitamin D deficiency, unspecified: Secondary | ICD-10-CM | POA: Diagnosis not present

## 2022-05-07 DIAGNOSIS — E538 Deficiency of other specified B group vitamins: Secondary | ICD-10-CM | POA: Diagnosis not present

## 2022-08-21 DIAGNOSIS — C7951 Secondary malignant neoplasm of bone: Secondary | ICD-10-CM | POA: Diagnosis not present

## 2022-08-21 DIAGNOSIS — E559 Vitamin D deficiency, unspecified: Secondary | ICD-10-CM | POA: Diagnosis not present

## 2022-08-21 DIAGNOSIS — M818 Other osteoporosis without current pathological fracture: Secondary | ICD-10-CM | POA: Diagnosis not present

## 2022-08-21 DIAGNOSIS — T387X5A Adverse effect of androgens and anabolic congeners, initial encounter: Secondary | ICD-10-CM | POA: Diagnosis not present

## 2022-08-21 DIAGNOSIS — E039 Hypothyroidism, unspecified: Secondary | ICD-10-CM | POA: Diagnosis not present

## 2022-08-21 DIAGNOSIS — C61 Malignant neoplasm of prostate: Secondary | ICD-10-CM | POA: Diagnosis not present

## 2022-09-17 ENCOUNTER — Other Ambulatory Visit: Payer: Self-pay | Admitting: Internal Medicine

## 2022-09-17 DIAGNOSIS — E042 Nontoxic multinodular goiter: Secondary | ICD-10-CM | POA: Diagnosis not present

## 2022-09-28 ENCOUNTER — Ambulatory Visit
Admission: RE | Admit: 2022-09-28 | Discharge: 2022-09-28 | Disposition: A | Discharge status: Home / Self Care | Payer: Medicare PPO | Source: Ambulatory Visit | Attending: Internal Medicine | Admitting: Internal Medicine

## 2022-09-28 DIAGNOSIS — E042 Nontoxic multinodular goiter: Secondary | ICD-10-CM

## 2022-09-28 DIAGNOSIS — E041 Nontoxic single thyroid nodule: Secondary | ICD-10-CM | POA: Diagnosis not present

## 2022-11-16 DIAGNOSIS — C61 Malignant neoplasm of prostate: Secondary | ICD-10-CM | POA: Diagnosis not present

## 2022-11-16 DIAGNOSIS — E559 Vitamin D deficiency, unspecified: Secondary | ICD-10-CM | POA: Diagnosis not present

## 2023-02-15 ENCOUNTER — Other Ambulatory Visit (HOSPITAL_COMMUNITY): Payer: Self-pay

## 2023-02-15 DIAGNOSIS — E559 Vitamin D deficiency, unspecified: Secondary | ICD-10-CM | POA: Diagnosis not present

## 2023-02-15 DIAGNOSIS — C61 Malignant neoplasm of prostate: Secondary | ICD-10-CM | POA: Diagnosis not present

## 2023-02-15 DIAGNOSIS — C7951 Secondary malignant neoplasm of bone: Secondary | ICD-10-CM

## 2023-02-19 ENCOUNTER — Other Ambulatory Visit (HOSPITAL_COMMUNITY): Payer: Self-pay

## 2023-02-19 DIAGNOSIS — C61 Malignant neoplasm of prostate: Secondary | ICD-10-CM

## 2023-02-19 DIAGNOSIS — E559 Vitamin D deficiency, unspecified: Secondary | ICD-10-CM

## 2023-02-22 ENCOUNTER — Other Ambulatory Visit: Payer: Self-pay

## 2023-02-22 DIAGNOSIS — C61 Malignant neoplasm of prostate: Secondary | ICD-10-CM

## 2023-03-11 DIAGNOSIS — X32XXXA Exposure to sunlight, initial encounter: Secondary | ICD-10-CM | POA: Diagnosis not present

## 2023-03-11 DIAGNOSIS — D0461 Carcinoma in situ of skin of right upper limb, including shoulder: Secondary | ICD-10-CM | POA: Diagnosis not present

## 2023-03-11 DIAGNOSIS — D044 Carcinoma in situ of skin of scalp and neck: Secondary | ICD-10-CM | POA: Diagnosis not present

## 2023-03-11 DIAGNOSIS — L570 Actinic keratosis: Secondary | ICD-10-CM | POA: Diagnosis not present

## 2023-04-06 DIAGNOSIS — C61 Malignant neoplasm of prostate: Secondary | ICD-10-CM | POA: Diagnosis not present

## 2023-04-06 DIAGNOSIS — E785 Hyperlipidemia, unspecified: Secondary | ICD-10-CM | POA: Diagnosis not present

## 2023-04-06 DIAGNOSIS — M8580 Other specified disorders of bone density and structure, unspecified site: Secondary | ICD-10-CM | POA: Diagnosis not present

## 2023-04-06 DIAGNOSIS — D649 Anemia, unspecified: Secondary | ICD-10-CM | POA: Diagnosis not present

## 2023-04-06 DIAGNOSIS — Z0000 Encounter for general adult medical examination without abnormal findings: Secondary | ICD-10-CM | POA: Diagnosis not present

## 2023-04-06 DIAGNOSIS — E041 Nontoxic single thyroid nodule: Secondary | ICD-10-CM | POA: Diagnosis not present

## 2023-04-06 DIAGNOSIS — R7309 Other abnormal glucose: Secondary | ICD-10-CM | POA: Diagnosis not present

## 2023-04-06 DIAGNOSIS — E538 Deficiency of other specified B group vitamins: Secondary | ICD-10-CM | POA: Diagnosis not present

## 2023-04-06 DIAGNOSIS — G629 Polyneuropathy, unspecified: Secondary | ICD-10-CM | POA: Diagnosis not present

## 2023-04-12 ENCOUNTER — Ambulatory Visit
Admission: RE | Admit: 2023-04-12 | Discharge: 2023-04-12 | Disposition: A | Discharge status: Home / Self Care | Payer: Medicare PPO | Source: Ambulatory Visit

## 2023-04-12 DIAGNOSIS — C7951 Secondary malignant neoplasm of bone: Secondary | ICD-10-CM | POA: Diagnosis not present

## 2023-04-12 DIAGNOSIS — M810 Age-related osteoporosis without current pathological fracture: Secondary | ICD-10-CM | POA: Diagnosis not present

## 2023-04-12 DIAGNOSIS — E559 Vitamin D deficiency, unspecified: Secondary | ICD-10-CM | POA: Insufficient documentation

## 2023-04-12 DIAGNOSIS — C61 Malignant neoplasm of prostate: Secondary | ICD-10-CM | POA: Insufficient documentation

## 2023-04-15 ENCOUNTER — Encounter (HOSPITAL_COMMUNITY): Payer: Self-pay

## 2023-04-15 ENCOUNTER — Encounter (HOSPITAL_COMMUNITY)
Admission: RE | Admit: 2023-04-15 | Discharge: 2023-04-15 | Disposition: A | Discharge status: Home / Self Care | Payer: Medicare PPO | Source: Ambulatory Visit

## 2023-04-15 ENCOUNTER — Ambulatory Visit (HOSPITAL_COMMUNITY): Payer: Medicare PPO

## 2023-04-15 DIAGNOSIS — C61 Malignant neoplasm of prostate: Secondary | ICD-10-CM | POA: Insufficient documentation

## 2023-04-15 DIAGNOSIS — C7951 Secondary malignant neoplasm of bone: Secondary | ICD-10-CM | POA: Diagnosis not present

## 2023-04-15 DIAGNOSIS — E559 Vitamin D deficiency, unspecified: Secondary | ICD-10-CM | POA: Insufficient documentation

## 2023-04-15 DIAGNOSIS — M19019 Primary osteoarthritis, unspecified shoulder: Secondary | ICD-10-CM | POA: Diagnosis not present

## 2023-04-15 MED ORDER — TECHNETIUM TC 99M MEDRONATE IV KIT
22.0000 | PACK | Freq: Once | INTRAVENOUS | Status: AC | PRN
  Administered 2023-04-15: 22 via INTRAVENOUS

## 2023-05-13 DIAGNOSIS — Z08 Encounter for follow-up examination after completed treatment for malignant neoplasm: Secondary | ICD-10-CM | POA: Diagnosis not present

## 2023-05-13 DIAGNOSIS — X32XXXD Exposure to sunlight, subsequent encounter: Secondary | ICD-10-CM | POA: Diagnosis not present

## 2023-05-13 DIAGNOSIS — L570 Actinic keratosis: Secondary | ICD-10-CM | POA: Diagnosis not present

## 2023-05-13 DIAGNOSIS — Z85828 Personal history of other malignant neoplasm of skin: Secondary | ICD-10-CM | POA: Diagnosis not present

## 2023-05-13 DIAGNOSIS — L820 Inflamed seborrheic keratosis: Secondary | ICD-10-CM | POA: Diagnosis not present

## 2023-05-14 ENCOUNTER — Ambulatory Visit (HOSPITAL_COMMUNITY)
Admission: RE | Admit: 2023-05-14 | Discharge: 2023-05-14 | Disposition: A | Discharge status: Home / Self Care | Payer: Medicare PPO | Source: Ambulatory Visit

## 2023-05-14 DIAGNOSIS — I700 Atherosclerosis of aorta: Secondary | ICD-10-CM | POA: Diagnosis not present

## 2023-05-14 DIAGNOSIS — C7951 Secondary malignant neoplasm of bone: Secondary | ICD-10-CM | POA: Diagnosis not present

## 2023-05-14 DIAGNOSIS — E559 Vitamin D deficiency, unspecified: Secondary | ICD-10-CM | POA: Insufficient documentation

## 2023-05-14 DIAGNOSIS — C61 Malignant neoplasm of prostate: Secondary | ICD-10-CM | POA: Insufficient documentation

## 2023-05-14 MED ORDER — IOHEXOL 350 MG/ML SOLN
75.0000 mL | Freq: Once | INTRAVENOUS | Status: AC | PRN
  Administered 2023-05-14: 75 mL via INTRAVENOUS

## 2023-05-18 DIAGNOSIS — E559 Vitamin D deficiency, unspecified: Secondary | ICD-10-CM | POA: Diagnosis not present

## 2023-05-18 DIAGNOSIS — T387X5A Adverse effect of androgens and anabolic congeners, initial encounter: Secondary | ICD-10-CM | POA: Diagnosis not present

## 2023-05-18 DIAGNOSIS — M818 Other osteoporosis without current pathological fracture: Secondary | ICD-10-CM | POA: Diagnosis not present

## 2023-05-18 DIAGNOSIS — C7951 Secondary malignant neoplasm of bone: Secondary | ICD-10-CM | POA: Diagnosis not present

## 2023-05-18 DIAGNOSIS — C61 Malignant neoplasm of prostate: Secondary | ICD-10-CM | POA: Diagnosis not present

## 2023-06-22 DIAGNOSIS — R0982 Postnasal drip: Secondary | ICD-10-CM | POA: Diagnosis not present

## 2023-06-22 DIAGNOSIS — M810 Age-related osteoporosis without current pathological fracture: Secondary | ICD-10-CM | POA: Diagnosis not present

## 2023-06-22 DIAGNOSIS — R0989 Other specified symptoms and signs involving the circulatory and respiratory systems: Secondary | ICD-10-CM | POA: Diagnosis not present

## 2023-06-30 DIAGNOSIS — C7951 Secondary malignant neoplasm of bone: Secondary | ICD-10-CM | POA: Diagnosis not present

## 2023-06-30 DIAGNOSIS — C61 Malignant neoplasm of prostate: Secondary | ICD-10-CM | POA: Diagnosis not present

## 2023-07-27 NOTE — Progress Notes (Signed)
 [facility] CONSULT NOTE  Patient Care Team: Kip Righter, MD as PCP - General (Family Medicine)  ASSESSMENT & PLAN:  Eric Riley is a 87 y.o.male with history of low back surgery, peripheral neuropathy, prostate cancer and osteoporosis being seen at [facility] for prostate cancer. He had bilateral orchiectomy.   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 Somatic testing: not yet. Can be discussed in the future Treatment: AAP.  History of orchiectomy (06/05/2016)  The patient was counseled on the natural history of prostate cancer and the standard treatment options that are available for prostate cancer.  He seems to be responding to AAP very well at this time.  If PSA remains undetectable, we will continue current treatment and monitor.  Hormone resistant prostate cancer (HCC) Continue AAP with ADT Monitor CMP, CBC, PSA and testosterone   Osteoporosis 04/12/23 DXA Bone Density with osteoporosis  Continue Vit D3 and Calcium daily Continue weight bearing exercises  He has scheduled to get Prolia  with endocrinology at [facility].  At risk for side effect of medication Monitor for BP Will need to monitor liver dysfunction with abiraterone  Monitor for osteoporosis from prostate cancer treatment Monitor for signs of MI, stroke, severe hypertension from abiraterone . Aggressive cardiovascular risk management  Follow up in 3 months with lab a day or 2 before visit.  Orders Placed This Encounter  Procedures   CBC with Differential (Cancer Center Only)    Standing Status:   Future    Number of Occurrences:   1    Expiration Date:   07/28/2024   CMP (Cancer Center only)    Standing Status:   Future    Number of Occurrences:   1    Expiration Date:   07/28/2024   PSA, total and free    Standing Status:   Future    Number of Occurrences:   1    Expiration Date:   07/28/2024   Testosterone      Standing Status:   Future    Number of Occurrences:   1    Expiration Date:   07/28/2024    All questions were answered. The patient knows to call the clinic with any problems, questions or concerns. No barriers to learning was detected.  Eric JAYSON Chihuahua, MD 1/2/202511:34 AM  CHIEF COMPLAINTS/PURPOSE OF CONSULTATION:  Prostate cancer  HISTORY OF PRESENTING ILLNESS:  Eric Riley 87 y.o. male is here because of prostate cancer. I have reviewed his chart and materials related to his cancer extensively and collaborated history with the patient. Summary of oncologic history is as follows: Oncology History   No history exists.   He think it was about 2007 diagnosis of very low risk T1cN0M0-Gleason 6(3+3)-PSA 4? prostate cancer proceed with surveillance He was living in [place] . He had a PSA which was about 4. He did have adenocarcinoma with a Gleason score of 6 (3+3) involving 4% of a core from the left lateral apex and 7% of a core from the left medial apex. The patient's father had had an indolent prostate cancer and ended up dying of dementia, so the patient decided that he did not want any treatment or further evaluation.  03/2016- [facility] consultation due to pain in the penis seeking evaluation.  04/22/2016 PET/CT reported metastatic disease to the bone. Result Impression 1. Multiple focal areas of abnormal radiotracer uptake within the axial and appendicular skeleton as above. Several of these lesions within  the ribs, sacrum, and left temporal bone are indeterminate, and may represent osseous metastatic disease. Correlate with PSA levels. 2. Extensive degenerative and posttraumatic radiotracer uptake throughout the axial and appendicular skeleton. 3. Marked gallbladder distention without surrounding inflammatory change. Question hydrops. 4. Indeterminate 9 mm noncalcified nodules in the left upper lobe and right upper lobe as mentioned above. Recommend correlation with  any outside prior examinations to establish stability. If none exist, follow-up chest CT is recommended in 3-6 months to establish stability. Etiologic possibilities include post infectious/inflammatory nodules, or neoplasm. 5. Multiple low-attenuation nodules within the inferior thyroid  lobes, suggesting multinodular goiter. 6. Atherosclerotic disease with coronary calcifications.  05/2016 Bilateral orchiectomy performed on June 05, 2016. 06/2016 started AAP with complete response of undetectable PSA.  3/22- mCRPC-Provenge  therapy-continue Zytiga , prednisone , s/p bilateral orchiectomy 12/2020 PSA 0.3 elected for Provenge  02/01/21 CT AP Impression: No evidence of metastatic disease within the abdomen or pelvis.   10/22 TURP w/prostatic adenocarcinoma, Gleason score/pattern 4 + 4 = 8 (grade group 4), continue Zytiga , prednisone , s/p bilateral orchiectomy  MOLECULAR DIAGNOSTICS AND MARKERS  [facility] Diagnostic Testing Results Sample collection date: 02/09/2018 Report date: 03/05/2018 Test performed: Sequence analysis and deletion/duplication testing of the 87 genes listed: [facility] Multi-Cancer Panel & 7 individual genes. This diagnostic test evaluates the gene(s) for variants (genetic changes) that are associated with genetic disorders. NEGATIVE  This test did not identify any pathogenic variants, but includes at least one result that is not completely understood at this time. Clinical Summary A Variant of Uncertain Significance, c.1009C>T (p.Arg337Cys), was identified in ATM. The ATM gene is associated with autosomal dominant predisposition to breast, pancreatic and prostate cancers (PMID: 84071697, 84057374, 83001494, 77414832, 73516605, 73337821, 72566153, 72675011, 72010645) and autosomal recessive ataxia-telangiectasia (A-T) (MedGen UID: 439). A Variant of Uncertain Significance, c.431C>T (p.Pro144Leu), was identified in CDKN1C. The CDKN1C gene is associated with autosomal dominant  Beckwith-Wiedemann syndrome (BWS) (MedGen UID: 2562) and IMAGe syndrome (intrauterine growth restriction, metaphyseal dysplasia, adrenal hypoplasia congenita, and genital anomalies) (MedGen UID: 662635).  04/15/23 bone scan: IMPRESSION: No evidence of osseous metastatic disease.  05/14/23 CT CAP 1. Scattered bilateral pulmonary nodules measuring up to 11 mm in the right upper lobe, nonspecific and could be infectious/inflammatory or metastatic. Consider more definitive assessment by PMSA PET-CT. 2. Clustered areas of nodularity in the peripheral bilateral lower lobes, lingula and right middle lobe with mucoid impaction in the right middle lobe, likely infectious/inflammatory. Attention on follow-up imaging suggested. 3. Avidly enhancing heterogeneous nodule in the right lobe of the thyroid  measuring 2.5 cm. Consider further evaluation with ultrasound-guided FNA. 4. Wall thickening versus underdistention of the gastric antrum, correlate with symptoms of gastritis. 5. Mild wall thickening of a minimally distended urinary bladder, correlate with urinalysis to exclude cystitis. 6.  Aortic Atherosclerosis (ICD10-I70.0).   05/18/23 PSA 0.01  He reports no much side effects anymore from APP. He just ran out of prednisone .  MEDICAL HISTORY:  No past medical history on file.  SURGICAL HISTORY: Back surgeries TURP  SOCIAL HISTORY: Social History   Socioeconomic History   Marital status: Unknown    Spouse name: Not on file   Number of children: Not on file   Years of education: Not on file   Highest education level: Not on file  Occupational History   Not on file  Tobacco Use   Smoking status: Not on file   Smokeless tobacco: Not on file  Substance and Sexual Activity   Alcohol use: Not on file   Drug use:  Not on file   Sexual activity: Not on file  Other Topics Concern   Not on file  Social History Narrative   Not on file   Social Drivers of Health   Financial Resource  Strain: Not on file  Food Insecurity: Not on file  Transportation Needs: Unknown (05/17/2021)   Received from [facility], [facility]   [facility] - Transportation    Lack of Transportation (Medical): Not on file    Lack of Transportation (Non-Medical): No  Physical Activity: Not on file  Stress: Not on file  Social Connections: Not on file  Intimate Partner Violence: Not on file    FAMILY HISTORY: No family history on file.  ALLERGIES:  is allergic to sulfa antibiotics.  MEDICATIONS:  Current Outpatient Medications  Medication Sig Dispense Refill   Cholecalciferol (VITAMIN D-1000 MAX ST) 25 MCG (1000 UT) tablet Take 1,000 Units by mouth daily.     denosumab  (PROLIA ) 60 MG/ML SOSY injection Inject 60 mg into the skin every 6 (six) months.     diphenhydrAMINE (BENADRYL) 25 mg capsule Take 25 mg by mouth at bedtime as needed.     estradiol (VIVELLE-DOT) 0.025 MG/24HR Place 1 patch onto the skin 2 (two) times a week.     Iron, Ferrous Sulfate, 325 (65 Fe) MG TABS Take 325 mg by mouth daily.     potassium chloride  SA (KLOR-CON  M) 20 MEQ tablet Take 20 mEq by mouth once.     abiraterone  acetate (ZYTIGA ) 250 MG tablet Take 4 tablets (1,000 mg total) by mouth daily. Take on an empty stomach, 1 hour before or 2 hours after a meal. 120 tablet 11   predniSONE  (DELTASONE ) 5 MG tablet Take 1 tablet (5 mg total) by mouth daily at 6 (six) AM. 90 tablet 3   No current facility-administered medications for this visit.    REVIEW OF SYSTEMS:   All relevant systems were reviewed with the patient and are negative.  PHYSICAL EXAMINATION: ECOG PERFORMANCE STATUS: 1 - Symptomatic but completely ambulatory  Vitals:   07/29/23 1030  BP: (!) 149/85  Pulse: 71  Resp: 16  SpO2: 99%   Filed Weights   07/29/23 1030  Weight: 165 lb 14.4 oz (75.3 kg)    GENERAL: alert, no distress and comfortable SKIN: skin color is normal, no jaundice EYES: sclera  clear OROPHARYNX: no exudate, no erythema NECK: supple LYMPH:  no palpable lymphadenopathy in the cervical regions LUNGS: Effort normal, no respiratory distress.  Clear to auscultation bilaterally HEART: regular rate & rhythm and no lower extremity edema ABDOMEN: soft, non-tender and nondistended Musculoskeletal: no point tenderness  LABORATORY DATA:  I have reviewed the data as listed Lab Results  Component Value Date   WBC 5.0 07/29/2023   HGB 12.6 (L) 07/29/2023   HCT 37.4 (L) 07/29/2023   MCV 92.6 07/29/2023   PLT 271 07/29/2023   No results for input(s): NA, K, CL, CO2, GLUCOSE, BUN, CREATININE, CALCIUM, GFRNONAA, GFRAA, PROT, ALBUMIN, AST, ALT, ALKPHOS, BILITOT, BILIDIR, IBILI in the last 8760 hours.  RADIOGRAPHIC STUDIES: I have personally reviewed the radiological images as listed and agreed with the findings in the report. No results found.

## 2023-07-28 DIAGNOSIS — C61 Malignant neoplasm of prostate: Secondary | ICD-10-CM | POA: Insufficient documentation

## 2023-07-28 DIAGNOSIS — Z9189 Other specified personal risk factors, not elsewhere classified: Secondary | ICD-10-CM | POA: Insufficient documentation

## 2023-07-28 DIAGNOSIS — M810 Age-related osteoporosis without current pathological fracture: Secondary | ICD-10-CM | POA: Insufficient documentation

## 2023-07-28 NOTE — Assessment & Plan Note (Addendum)
 04/12/23 DXA Bone Density with osteoporosis  Continue Vit D3 and Calcium daily Continue weight bearing exercises  He has scheduled to get Prolia with endocrinology at [facility].

## 2023-07-28 NOTE — Assessment & Plan Note (Signed)
 Continue AAP with ADT Monitor CMP, CBC, PSA and testosterone

## 2023-07-28 NOTE — Assessment & Plan Note (Addendum)
 Monitor for BP Will need to monitor liver dysfunction with abiraterone Monitor for osteoporosis from prostate cancer treatment Monitor for signs of MI, stroke, severe hypertension from abiraterone. Aggressive cardiovascular risk management

## 2023-07-29 ENCOUNTER — Inpatient Hospital Stay: Payer: Medicare PPO

## 2023-07-29 ENCOUNTER — Telehealth: Payer: Self-pay

## 2023-07-29 VITALS — BP 149/85 | HR 71 | Resp 16 | Wt 165.9 lb

## 2023-07-29 DIAGNOSIS — C61 Malignant neoplasm of prostate: Secondary | ICD-10-CM

## 2023-07-29 DIAGNOSIS — I2510 Atherosclerotic heart disease of native coronary artery without angina pectoris: Secondary | ICD-10-CM | POA: Diagnosis not present

## 2023-07-29 DIAGNOSIS — M818 Other osteoporosis without current pathological fracture: Secondary | ICD-10-CM

## 2023-07-29 DIAGNOSIS — Z9189 Other specified personal risk factors, not elsewhere classified: Secondary | ICD-10-CM | POA: Diagnosis not present

## 2023-07-29 DIAGNOSIS — R918 Other nonspecific abnormal finding of lung field: Secondary | ICD-10-CM | POA: Insufficient documentation

## 2023-07-29 DIAGNOSIS — M810 Age-related osteoporosis without current pathological fracture: Secondary | ICD-10-CM | POA: Insufficient documentation

## 2023-07-29 DIAGNOSIS — Z192 Hormone resistant malignancy status: Secondary | ICD-10-CM | POA: Insufficient documentation

## 2023-07-29 LAB — CBC WITH DIFFERENTIAL (CANCER CENTER ONLY)
Abs Immature Granulocytes: 0.01 10*3/uL (ref 0.00–0.07)
Basophils Absolute: 0 10*3/uL (ref 0.0–0.1)
Basophils Relative: 1 %
Eosinophils Absolute: 0.1 10*3/uL (ref 0.0–0.5)
Eosinophils Relative: 2 %
HCT: 37.4 % — ABNORMAL LOW (ref 39.0–52.0)
Hemoglobin: 12.6 g/dL — ABNORMAL LOW (ref 13.0–17.0)
Immature Granulocytes: 0 %
Lymphocytes Relative: 24 %
Lymphs Abs: 1.2 10*3/uL (ref 0.7–4.0)
MCH: 31.2 pg (ref 26.0–34.0)
MCHC: 33.7 g/dL (ref 30.0–36.0)
MCV: 92.6 fL (ref 80.0–100.0)
Monocytes Absolute: 0.6 10*3/uL (ref 0.1–1.0)
Monocytes Relative: 11 %
Neutro Abs: 3.1 10*3/uL (ref 1.7–7.7)
Neutrophils Relative %: 62 %
Platelet Count: 271 10*3/uL (ref 150–400)
RBC: 4.04 MIL/uL — ABNORMAL LOW (ref 4.22–5.81)
RDW: 12 % (ref 11.5–15.5)
WBC Count: 5 10*3/uL (ref 4.0–10.5)
nRBC: 0 % (ref 0.0–0.2)

## 2023-07-29 LAB — CMP (CANCER CENTER ONLY)
ALT: 9 U/L (ref 0–44)
AST: 20 U/L (ref 15–41)
Albumin: 4.2 g/dL (ref 3.5–5.0)
Alkaline Phosphatase: 71 U/L (ref 38–126)
Anion gap: 4 — ABNORMAL LOW (ref 5–15)
BUN: 19 mg/dL (ref 8–23)
CO2: 28 mmol/L (ref 22–32)
Calcium: 9.6 mg/dL (ref 8.9–10.3)
Chloride: 107 mmol/L (ref 98–111)
Creatinine: 1.03 mg/dL (ref 0.61–1.24)
GFR, Estimated: >60 mL/min (ref >60)
Glucose, Bld: 97 mg/dL (ref 70–99)
Potassium: 3.7 mmol/L (ref 3.5–5.1)
Sodium: 139 mmol/L (ref 135–145)
Total Bilirubin: 1 mg/dL (ref 0.0–1.2)
Total Protein: 7.1 g/dL (ref 6.5–8.1)

## 2023-07-29 MED ORDER — ABIRATERONE ACETATE 250 MG PO TABS: 1000.0000 mg | ORAL_TABLET | Freq: Every day | ORAL | 11 refills | Status: DC

## 2023-07-29 MED ORDER — PREDNISONE 5 MG PO TABS: 5.0000 mg | ORAL_TABLET | Freq: Every day | ORAL | 3 refills | Status: DC

## 2023-07-30 LAB — PSA, TOTAL AND FREE
PSA, Free Pct: UNDETERMINED %
PSA, Free: <0.02 ng/mL
Prostate Specific Ag, Serum: <0.1 ng/mL (ref 0.0–4.0)

## 2023-07-30 LAB — TESTOSTERONE: Testosterone: <3 ng/dL — ABNORMAL LOW (ref 264–916)

## 2023-08-01 ENCOUNTER — Other Ambulatory Visit: Payer: Self-pay

## 2023-08-01 DIAGNOSIS — C61 Malignant neoplasm of prostate: Secondary | ICD-10-CM

## 2023-08-01 NOTE — Progress Notes (Signed)
 Labs ordered to be completed before April visit.

## 2023-08-02 NOTE — Telephone Encounter (Signed)
 Oral Oncology Pharmacist Encounter  Received new prescription for Zytiga  (abiraterone ) for the treatment of prostate cancer in conjunction with prednisone , planned duration until disease progression or unacceptable toxicity.  Labs from 07/29/23 assessed, no interventions needed. Prescription dose and frequency assessed for appropriateness. Modified prescription to include for patient to take on an empty stomach.   Current medication list in Epic reviewed, no significant/ relevant DDIs with Zytiga  identified.  Evaluated chart and no patient barriers to medication adherence noted.   Patient agreement for treatment documented in MD note on 07/29/2023.  Prescription has been e-scribed to the [facility] for benefits analysis and approval. Patient has been filling medication through [facility] specialty pharmacy and is transferring care to [facility]. Patient will not need counseling as patient has been receiving medication and knows how to take medication.   Cire Deyarmin, PharmD Hematology/Oncology Clinical Pharmacist [facility] Oral Chemotherapy Navigation Clinic [phone] 08/02/2023 10:29 AM

## 2023-08-03 ENCOUNTER — Telehealth: Payer: Self-pay | Admitting: Pharmacy Technician

## 2023-08-03 NOTE — Telephone Encounter (Signed)
 Oral Oncology Patient Advocate Encounter  Prior Authorization for abiraterone  has been approved.    PA# [id number] Effective dates: 08/03/23 through 07/26/24  Patient may continue to fill as normal.    Estefana Moellers, CPhT-Adv Oncology Pharmacy Patient Advocate [facility] Direct Number: [phone]  Fax: [phone]

## 2023-08-03 NOTE — Telephone Encounter (Signed)
 Oral Oncology Patient Advocate Encounter   Received notification that prior authorization for abiraterone  is due for renewal.   PA submitted on 08/03/23 Key [id number] Status is pending     Eric Riley, CPhT-Adv Oncology Pharmacy Patient Advocate [facility] Direct Number: [phone]  Fax: [phone]

## 2023-09-03 ENCOUNTER — Other Ambulatory Visit (HOSPITAL_COMMUNITY): Payer: Self-pay

## 2023-09-03 ENCOUNTER — Telehealth: Payer: Self-pay | Admitting: Pharmacy Technician

## 2023-09-03 NOTE — Telephone Encounter (Signed)
 Oral Oncology Patient Advocate Encounter  Received request from patient to have abiraterone  sent to [facility] pharmacy. Left vm with patient to confirm they want to switch to this pharmacy since it will not accept any form of insurance.  If patient decides to proceed with having abiraterone  sent to [facility], the cost is $250 per 30 day supply. Patient also has the option to switch to the [facility] and use their cash price of $140 per 30 day supply.  Once patient has decided which they prefer rx can be redirected to the appropriate location.  Estefana Moellers, CPhT-Adv Oncology Pharmacy Patient Advocate [facility] Direct Number: [phone]  Fax: [phone]

## 2023-09-06 ENCOUNTER — Other Ambulatory Visit (HOSPITAL_COMMUNITY): Payer: Self-pay

## 2023-09-06 ENCOUNTER — Other Ambulatory Visit: Payer: Self-pay | Admitting: Pharmacy Technician

## 2023-09-06 ENCOUNTER — Other Ambulatory Visit: Payer: Self-pay

## 2023-09-06 DIAGNOSIS — C61 Malignant neoplasm of prostate: Secondary | ICD-10-CM

## 2023-09-06 MED ORDER — ABIRATERONE ACETATE 250 MG PO TABS
1000.0000 mg | ORAL_TABLET | Freq: Every day | ORAL | 9 refills | Status: DC
  Filled 2023-09-06: qty 120, 30d supply, fill #0
  Filled 2023-10-13: qty 120, 30d supply, fill #1
  Filled 2023-11-11: qty 120, 30d supply, fill #2
  Filled 2023-12-13: qty 120, 30d supply, fill #3
  Filled 2024-01-17 – 2024-01-19 (×2): qty 120, 30d supply, fill #4
  Filled 2024-02-09: qty 120, 30d supply, fill #5
  Filled 2024-03-16: qty 120, 30d supply, fill #6
  Filled 2024-04-13: qty 120, 30d supply, fill #7
  Filled 2024-05-09 (×2): qty 120, 30d supply, fill #8
  Filled 2024-06-01: qty 120, 30d supply, fill #9

## 2023-09-06 NOTE — Telephone Encounter (Signed)
 Oral Oncology Patient Advocate Encounter  Patient agreed to [facility] cash pricing for abiraterone  going forward. Rx will be redirected.  Paulette Borrow, CPhT-Adv Oncology Pharmacy Patient Advocate [facility] Direct Number: [phone]  Fax: [phone]

## 2023-09-06 NOTE — Progress Notes (Signed)
Specialty Pharmacy Initial Fill Coordination Note  Eric Riley is a 88 y.o. male contacted today regarding refills of specialty medication(s) Abiraterone Acetate (ZYTIGA) .  Patient requested Delivery  on 09/21/23  to verified address [address]   [place] [address]   Medication will be filled on 09/20/23.   Patient is aware of $140 copayment.

## 2023-09-07 NOTE — Progress Notes (Signed)
Specialty Pharmacy Initiation Note   Eric Riley is a 88 y.o. male who will be followed by the specialty pharmacy service for RxSp Oncology. Patient has been on therapy and was transferred to [facility] long to receive medication. Patient previously was receiving from [facility] specialty pharmacy.   Review of administration, indication, effectiveness, safety, potential side effects, storage/disposable, and missed dose instructions occurred today for patient's specialty medication(s) Abiraterone Acetate (ZYTIGA)     Patient/Caregiver did not have any additional questions or concerns.   Patient's therapy is appropriate to: Continue    Goals Addressed             This Visit's Progress    Maintain optimal adherence to therapy       Patient is on track. Patient will maintain adherence        Eric Riley, PharmD Hematology/Oncology Clinical Pharmacist [facility] Oral Chemotherapy Navigation Clinic [phone]

## 2023-09-20 ENCOUNTER — Other Ambulatory Visit: Payer: Self-pay

## 2023-09-20 DIAGNOSIS — M810 Age-related osteoporosis without current pathological fracture: Secondary | ICD-10-CM | POA: Diagnosis not present

## 2023-09-20 DIAGNOSIS — E042 Nontoxic multinodular goiter: Secondary | ICD-10-CM | POA: Diagnosis not present

## 2023-09-24 ENCOUNTER — Other Ambulatory Visit: Payer: Self-pay

## 2023-09-30 ENCOUNTER — Other Ambulatory Visit: Payer: Self-pay

## 2023-10-05 ENCOUNTER — Other Ambulatory Visit (HOSPITAL_COMMUNITY): Payer: Self-pay

## 2023-10-07 ENCOUNTER — Other Ambulatory Visit (HOSPITAL_COMMUNITY): Payer: Self-pay

## 2023-10-11 ENCOUNTER — Other Ambulatory Visit: Payer: Self-pay

## 2023-10-13 ENCOUNTER — Other Ambulatory Visit: Payer: Self-pay

## 2023-10-13 NOTE — Progress Notes (Signed)
 Specialty Pharmacy Ongoing Clinical Assessment Note  Eric Riley is a 88 y.o. male who is being followed by the specialty pharmacy service for RxSp Oncology   Patient's specialty medication(s) reviewed today: Abiraterone Acetate (ZYTIGA)   Missed doses in the last 4 weeks: 0   Patient/Caregiver did not have any additional questions or concerns.   Therapeutic benefit summary: Patient is achieving benefit   Adverse events/side effects summary: No adverse events/side effects   Patient's therapy is appropriate to: Continue    Goals Addressed             This Visit's Progress    Maintain optimal adherence to therapy   On track    Patient is on track. Patient will maintain adherence         Follow up:  6 months  Otto Herb Specialty Pharmacist

## 2023-10-13 NOTE — Progress Notes (Signed)
 Specialty Pharmacy Refill Coordination Note  KNIGHT OELKERS is a 88 y.o. male contacted today regarding refills of specialty medication(s) Abiraterone Acetate (ZYTIGA)   Patient requested (Patient-Rptd) Delivery   Delivery date: (Patient-Rptd) 10/19/23   Verified address: (Patient-Rptd) [address] [place], [place] [address]   Medication will be filled on 03.24.25.

## 2023-10-18 ENCOUNTER — Other Ambulatory Visit: Payer: Self-pay

## 2023-10-26 ENCOUNTER — Other Ambulatory Visit: Payer: Self-pay

## 2023-10-26 ENCOUNTER — Inpatient Hospital Stay: Payer: Medicare PPO

## 2023-10-26 DIAGNOSIS — C7951 Secondary malignant neoplasm of bone: Secondary | ICD-10-CM | POA: Insufficient documentation

## 2023-10-26 DIAGNOSIS — C61 Malignant neoplasm of prostate: Secondary | ICD-10-CM | POA: Insufficient documentation

## 2023-10-26 DIAGNOSIS — M810 Age-related osteoporosis without current pathological fracture: Secondary | ICD-10-CM | POA: Diagnosis not present

## 2023-10-26 LAB — CBC WITH DIFFERENTIAL (CANCER CENTER ONLY)
Abs Immature Granulocytes: 0.01 10*3/uL (ref 0.00–0.07)
Basophils Absolute: 0 10*3/uL (ref 0.0–0.1)
Basophils Relative: 1 %
Eosinophils Absolute: 0.2 10*3/uL (ref 0.0–0.5)
Eosinophils Relative: 3 %
HCT: 38.3 % — ABNORMAL LOW (ref 39.0–52.0)
Hemoglobin: 12.8 g/dL — ABNORMAL LOW (ref 13.0–17.0)
Immature Granulocytes: 0 %
Lymphocytes Relative: 24 %
Lymphs Abs: 1.6 10*3/uL (ref 0.7–4.0)
MCH: 30.9 pg (ref 26.0–34.0)
MCHC: 33.4 g/dL (ref 30.0–36.0)
MCV: 92.5 fL (ref 80.0–100.0)
Monocytes Absolute: 0.5 10*3/uL (ref 0.1–1.0)
Monocytes Relative: 8 %
Neutro Abs: 4.1 10*3/uL (ref 1.7–7.7)
Neutrophils Relative %: 64 %
Platelet Count: 249 10*3/uL (ref 150–400)
RBC: 4.14 MIL/uL — ABNORMAL LOW (ref 4.22–5.81)
RDW: 12.4 % (ref 11.5–15.5)
WBC Count: 6.4 10*3/uL (ref 4.0–10.5)
nRBC: 0 % (ref 0.0–0.2)

## 2023-10-26 LAB — CMP (CANCER CENTER ONLY)
ALT: 10 U/L (ref 0–44)
AST: 18 U/L (ref 15–41)
Albumin: 4.6 g/dL (ref 3.5–5.0)
Alkaline Phosphatase: 62 U/L (ref 38–126)
Anion gap: 5 (ref 5–15)
BUN: 23 mg/dL (ref 8–23)
CO2: 28 mmol/L (ref 22–32)
Calcium: 9.6 mg/dL (ref 8.9–10.3)
Chloride: 105 mmol/L (ref 98–111)
Creatinine: 1.12 mg/dL (ref 0.61–1.24)
GFR, Estimated: >60 mL/min (ref >60)
Glucose, Bld: 108 mg/dL — ABNORMAL HIGH (ref 70–99)
Potassium: 4.7 mmol/L (ref 3.5–5.1)
Sodium: 138 mmol/L (ref 135–145)
Total Bilirubin: 1.1 mg/dL (ref 0.0–1.2)
Total Protein: 7.3 g/dL (ref 6.5–8.1)

## 2023-10-26 NOTE — Progress Notes (Unsigned)
 [facility] OFFICE PROGRESS NOTE  Patient Care Team: Farris Has, MD as PCP - General (Family Medicine)  Eric Riley is a 88 y.o.male with history of low back surgery, peripheral neuropathy, prostate cancer and osteoporosis being [facility] for [facility] cancer. He had bilateral orchiectomy.    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 Somatic testing: not yet. Can be discussed in the future Treatment: AAP.  History of orchiectomy (06/05/2016) 06/2016 started AAP with complete response of undetectable PSA. 09/2020- mCRPC-Provenge   The patient was counseled on the natural history of prostate cancer and the standard treatment options that are available for prostate cancer.  He seems to be responding to AAP very well at this time.  Repeat PSA was normal again.  If PSA continues to be undetectable, we will continue current treatment and monitor.  Stable from osteoporosis. Assessment & Plan Hormone resistant prostate cancer (HCC) Continue AAP with ADT Monitor CMP, CBC, PSA and testosterone Age-related osteoporosis without current pathological fracture 04/12/23 DXA Bone Density with osteoporosis  Continue Vit D3 and Calcium daily Continue weight bearing exercises  He has been switched to bisphosphonate with endocrinology at [facility]. At risk for side effect of medication Monitor for BP Will need to monitor liver dysfunction with abiraterone Monitor for osteoporosis from prostate cancer treatment Monitor for signs of MI, stroke, severe hypertension from abiraterone. Aggressive cardiovascular risk management  Orders Placed This Encounter  Procedures   CBC with Differential (Cancer Center Only)    Standing Status:   Future    Expiration Date:   10/27/2024   CMP (Cancer Center only)    Standing Status:   Future    Expiration Date:   10/27/2024   Testosterone    Standing Status:    Future    Expiration Date:   10/27/2024   Prostate-Specific AG, Serum    Standing Status:   Future    Expiration Date:   10/27/2024     [name], MD  INTERVAL HISTORY: Eric Riley 88 y.o. male is here for follow up for prostate cancer. No new pain or back pain. No falling.  He has neuropathy. He sees Endo at [place] [facility]. He takes calcium vit D. He also see them for thyroid nodule. Appetite is good. Energy is good. Able to exercises.  I have reviewed his chart and materials related to his cancer extensively and collaborated history with the patient. Summary of oncologic history is as follows:  Oncology History: He think it was about 2007 diagnosis of very low risk T1cN0M0-Gleason 6(3+3)-PSA 4? prostate cancer proceed with surveillance He was living in [place]. He had a PSA which was about 4. He did have adenocarcinoma with a Gleason score of 6 (3+3) involving 4% of a core from the left lateral apex and 7% of a core from the left medial apex. The patient's father had had an indolent prostate cancer and ended up dying of dementia, so the patient decided that he did not want any treatment or further evaluation.   03/2016- [facility] consultation due to pain in the penis seeking evaluation.   04/22/2016 PET/CT reported metastatic disease to the bone. Result Impression 1. Multiple focal areas of abnormal radiotracer uptake within the axial and appendicular skeleton as above. Several of these lesions within the ribs, sacrum, and left temporal bone are indeterminate, and may represent osseous metastatic disease. Correlate with PSA levels. 2. Extensive  degenerative and posttraumatic radiotracer uptake throughout the axial and appendicular skeleton. 3. Marked gallbladder distention without surrounding inflammatory change. Question hydrops. 4. Indeterminate 9 mm noncalcified nodules in the left upper lobe and right upper lobe as mentioned above. Recommend correlation with any  outside prior examinations to establish stability. If none exist, follow-up chest CT is recommended in 3-6 months to establish stability. Etiologic possibilities include post infectious/inflammatory nodules, or neoplasm. 5. Multiple low-attenuation nodules within the inferior thyroid lobes, suggesting multinodular goiter. 6. Atherosclerotic disease with coronary calcifications.  05/2016 Bilateral orchiectomy performed on June 05, 2016. 06/2016 started AAP with complete response of undetectable PSA.   3/22- mCRPC-Provenge therapy-continue Zytiga, prednisone, s/p bilateral orchiectomy 12/2020 PSA 0.3 elected for Provenge 02/01/21 CT AP Impression: No evidence of metastatic disease within the abdomen or pelvis.   10/22 TURP w/prostatic adenocarcinoma, Gleason score/pattern 4 + 4 = 8 (grade group 4), continue Zytiga, prednisone, s/p bilateral orchiectomy  06/2021 post surgery PSA <0.01. PSA 0.49 in 03/2021  MOLECULAR DIAGNOSTICS AND MARKERS  [facility] Diagnostic Testing Results Sample collection date: 02/09/2018 Report date: 03/05/2018 Test performed: Sequence analysis and deletion/duplication testing of the 87 genes listed: [facility] Multi-Cancer Panel & 7 individual genes. This diagnostic test evaluates the gene(s) for variants (genetic changes) that are associated with genetic disorders. NEGATIVE  This test did not identify any pathogenic variants, but includes at least one result that is not completely understood at this time. Clinical Summary A Variant of Uncertain Significance, c.1009C>T (p.Arg337Cys), was identified in ATM. The ATM gene is associated with autosomal dominant predisposition to breast, pancreatic and prostate cancers (PMID: 30865784, 69629528, 41324401, 02725366, 44034742, 59563875, 64332951, 88416606, 30160109) and autosomal recessive ataxia-telangiectasia (A-T) (MedGen UID: 439). A Variant of Uncertain Significance, c.431C>T (p.Pro144Leu), was identified in CDKN1C. The CDKN1C  gene is associated with autosomal dominant Beckwith-Wiedemann syndrome (BWS) (MedGen UID: 2562) and IMAGe syndrome (intrauterine growth restriction, metaphyseal dysplasia, adrenal hypoplasia congenita, and genital anomalies) (MedGen UID: 323557).   04/15/23 bone scan: IMPRESSION: No evidence of osseous metastatic disease.   05/14/23 CT CAP 1. Scattered bilateral pulmonary nodules measuring up to 11 mm in the right upper lobe, nonspecific and could be infectious/inflammatory or metastatic. Consider more definitive assessment by [facility] PET-CT. 2. Clustered areas of nodularity in the peripheral bilateral lower lobes, lingula and right middle lobe with mucoid impaction in the right middle lobe, likely infectious/inflammatory. Attention on follow-up imaging suggested. 3. Avidly enhancing heterogeneous nodule in the right lobe of the thyroid measuring 2.5 cm. Consider further evaluation with ultrasound-guided FNA. 4. Wall thickening versus underdistention of the gastric antrum, correlate with symptoms of gastritis. 5. Mild wall thickening of a minimally distended urinary bladder, correlate with urinalysis to exclude cystitis. 6.  Aortic Atherosclerosis (ICD10-I70.0).   05/18/23 PSA 0.01  07/29/23 first visit to [facility]. PSA <0.1 10/2023 PSA <0.1  PHYSICAL EXAMINATION: ECOG PERFORMANCE STATUS: 0 - Asymptomatic  Vitals:   10/28/23 1318  BP: 139/70  Pulse: 76  Resp: 15  Temp: (!) 97.5 F (36.4 C)  SpO2: 100%   Filed Weights   10/28/23 1318  Weight: 159 lb 4.8 oz (72.3 kg)    Relevant data reviewed during this visit included labs.

## 2023-10-27 LAB — PSA, TOTAL AND FREE
PSA, Free Pct: UNDETERMINED %
PSA, Free: <0.02 ng/mL
Prostate Specific Ag, Serum: <0.1 ng/mL (ref 0.0–4.0)

## 2023-10-27 LAB — TESTOSTERONE: Testosterone: <3 ng/dL — ABNORMAL LOW (ref 264–916)

## 2023-10-28 ENCOUNTER — Inpatient Hospital Stay (HOSPITAL_BASED_OUTPATIENT_CLINIC_OR_DEPARTMENT_OTHER): Payer: Medicare PPO

## 2023-10-28 VITALS — BP 139/70 | HR 76 | Temp 97.5°F | Resp 15 | Wt 159.3 lb

## 2023-10-28 DIAGNOSIS — Z192 Hormone resistant malignancy status: Secondary | ICD-10-CM

## 2023-10-28 DIAGNOSIS — C61 Malignant neoplasm of prostate: Secondary | ICD-10-CM

## 2023-10-28 DIAGNOSIS — Z9189 Other specified personal risk factors, not elsewhere classified: Secondary | ICD-10-CM

## 2023-10-28 DIAGNOSIS — M810 Age-related osteoporosis without current pathological fracture: Secondary | ICD-10-CM

## 2023-10-28 DIAGNOSIS — C7951 Secondary malignant neoplasm of bone: Secondary | ICD-10-CM | POA: Diagnosis not present

## 2023-10-28 NOTE — Assessment & Plan Note (Addendum)
 Monitor for BP Will need to monitor liver dysfunction with abiraterone Monitor for osteoporosis from prostate cancer treatment Monitor for signs of MI, stroke, severe hypertension from abiraterone. Aggressive cardiovascular risk management

## 2023-10-28 NOTE — Assessment & Plan Note (Addendum)
 Continue AAP with ADT Monitor CMP, CBC, PSA and testosterone

## 2023-10-28 NOTE — Assessment & Plan Note (Addendum)
 04/12/23 DXA Bone Density with osteoporosis  Continue Vit D3 and Calcium daily Continue weight bearing exercises  He has been switched to bisphosphonate with endocrinology at [facility].

## 2023-11-09 ENCOUNTER — Other Ambulatory Visit: Payer: Self-pay

## 2023-11-11 ENCOUNTER — Other Ambulatory Visit: Payer: Self-pay | Admitting: Pharmacy Technician

## 2023-11-11 ENCOUNTER — Other Ambulatory Visit: Payer: Self-pay

## 2023-11-11 NOTE — Progress Notes (Signed)
 Specialty Pharmacy Refill Coordination Note  Eric Riley is a 88 y.o. male contacted today regarding refills of specialty medication(s) Abiraterone Acetate (ZYTIGA)   Patient requested Delivery   Delivery date: 11/17/23   Verified address: [address] [place]   Medication will be filled on 11/16/23.

## 2023-11-16 ENCOUNTER — Other Ambulatory Visit: Payer: Self-pay

## 2023-11-18 DIAGNOSIS — X32XXXD Exposure to sunlight, subsequent encounter: Secondary | ICD-10-CM | POA: Diagnosis not present

## 2023-11-18 DIAGNOSIS — L570 Actinic keratosis: Secondary | ICD-10-CM | POA: Diagnosis not present

## 2023-11-18 DIAGNOSIS — Z08 Encounter for follow-up examination after completed treatment for malignant neoplasm: Secondary | ICD-10-CM | POA: Diagnosis not present

## 2023-11-18 DIAGNOSIS — Z85828 Personal history of other malignant neoplasm of skin: Secondary | ICD-10-CM | POA: Diagnosis not present

## 2023-12-06 ENCOUNTER — Other Ambulatory Visit: Payer: Self-pay

## 2023-12-08 ENCOUNTER — Other Ambulatory Visit: Payer: Self-pay

## 2023-12-13 ENCOUNTER — Other Ambulatory Visit: Payer: Self-pay

## 2023-12-13 ENCOUNTER — Other Ambulatory Visit (HOSPITAL_COMMUNITY): Payer: Self-pay

## 2023-12-13 ENCOUNTER — Other Ambulatory Visit: Payer: Self-pay | Admitting: Pharmacy Technician

## 2023-12-13 NOTE — Progress Notes (Signed)
 Specialty Pharmacy Refill Coordination Note  Eric Riley is a 88 y.o. male contacted today regarding refills of specialty medication(s) Abiraterone  Acetate (ZYTIGA )   Patient requested Delivery   Delivery date: 12/16/23   Verified address: [address] [place], [place] [address]   Medication will be filled on 12/15/23.

## 2023-12-15 ENCOUNTER — Other Ambulatory Visit (HOSPITAL_COMMUNITY): Payer: Self-pay

## 2023-12-15 ENCOUNTER — Other Ambulatory Visit: Payer: Self-pay

## 2024-01-13 ENCOUNTER — Other Ambulatory Visit: Payer: Self-pay

## 2024-01-17 ENCOUNTER — Other Ambulatory Visit: Payer: Self-pay

## 2024-01-19 ENCOUNTER — Other Ambulatory Visit: Payer: Self-pay

## 2024-01-19 ENCOUNTER — Other Ambulatory Visit (HOSPITAL_COMMUNITY): Payer: Self-pay

## 2024-01-19 ENCOUNTER — Telehealth: Payer: Self-pay | Admitting: *Deleted

## 2024-01-19 NOTE — Progress Notes (Signed)
 Specialty Pharmacy Refill Coordination Note  Eric Riley is a 87 y.o. male contacted today regarding refills of specialty medication(s) Abiraterone  Acetate (ZYTIGA )   Patient requested Delivery   Delivery date: 01/20/24   Verified address: [address] [place], [place] [address]   Medication will be filled on 01/19/24.

## 2024-01-19 NOTE — Telephone Encounter (Signed)
 Pt is requesting a refill of Potassium 20 meq to be sent to [facility].   Patient states he takes it 2 times per day, rather than daily as indicated on prescription in EPIC.   Was being filled by previous oncologist. Has 1 weeks worth remaining.

## 2024-01-20 ENCOUNTER — Other Ambulatory Visit: Payer: Self-pay

## 2024-01-20 MED ORDER — POTASSIUM CHLORIDE CRYS ER 20 MEQ PO TBCR: 20.0000 meq | EXTENDED_RELEASE_TABLET | Freq: Two times a day (BID) | ORAL | 1 refills | Status: DC

## 2024-01-20 NOTE — Progress Notes (Signed)
 Patient requests refilled. Report he has been taking potassium chloride 20 mEq twice daily and not once daily. Refill placed.

## 2024-01-25 ENCOUNTER — Other Ambulatory Visit

## 2024-01-27 ENCOUNTER — Ambulatory Visit

## 2024-02-07 ENCOUNTER — Other Ambulatory Visit (HOSPITAL_COMMUNITY): Payer: Self-pay

## 2024-02-09 ENCOUNTER — Other Ambulatory Visit: Payer: Self-pay | Admitting: Pharmacy Technician

## 2024-02-09 ENCOUNTER — Other Ambulatory Visit: Payer: Self-pay

## 2024-02-09 ENCOUNTER — Inpatient Hospital Stay

## 2024-02-09 ENCOUNTER — Encounter (INDEPENDENT_AMBULATORY_CARE_PROVIDER_SITE_OTHER): Payer: Self-pay

## 2024-02-09 ENCOUNTER — Other Ambulatory Visit (HOSPITAL_COMMUNITY): Payer: Self-pay

## 2024-02-09 DIAGNOSIS — E876 Hypokalemia: Secondary | ICD-10-CM | POA: Insufficient documentation

## 2024-02-09 DIAGNOSIS — G629 Polyneuropathy, unspecified: Secondary | ICD-10-CM | POA: Insufficient documentation

## 2024-02-09 DIAGNOSIS — C7951 Secondary malignant neoplasm of bone: Secondary | ICD-10-CM | POA: Diagnosis not present

## 2024-02-09 DIAGNOSIS — C61 Malignant neoplasm of prostate: Secondary | ICD-10-CM | POA: Diagnosis not present

## 2024-02-09 DIAGNOSIS — M818 Other osteoporosis without current pathological fracture: Secondary | ICD-10-CM | POA: Diagnosis not present

## 2024-02-09 LAB — CBC WITH DIFFERENTIAL (CANCER CENTER ONLY)
Abs Immature Granulocytes: 0.01 K/uL (ref 0.00–0.07)
Basophils Absolute: 0 K/uL (ref 0.0–0.1)
Basophils Relative: 1 %
Eosinophils Absolute: 0.1 K/uL (ref 0.0–0.5)
Eosinophils Relative: 2 %
HCT: 36.8 % — ABNORMAL LOW (ref 39.0–52.0)
Hemoglobin: 12.4 g/dL — ABNORMAL LOW (ref 13.0–17.0)
Immature Granulocytes: 0 %
Lymphocytes Relative: 30 %
Lymphs Abs: 1.7 K/uL (ref 0.7–4.0)
MCH: 30.6 pg (ref 26.0–34.0)
MCHC: 33.7 g/dL (ref 30.0–36.0)
MCV: 90.9 fL (ref 80.0–100.0)
Monocytes Absolute: 0.5 K/uL (ref 0.1–1.0)
Monocytes Relative: 9 %
Neutro Abs: 3.2 K/uL (ref 1.7–7.7)
Neutrophils Relative %: 58 %
Platelet Count: 255 K/uL (ref 150–400)
RBC: 4.05 MIL/uL — ABNORMAL LOW (ref 4.22–5.81)
RDW: 11.9 % (ref 11.5–15.5)
WBC Count: 5.6 K/uL (ref 4.0–10.5)
nRBC: 0 % (ref 0.0–0.2)

## 2024-02-09 LAB — CMP (CANCER CENTER ONLY)
ALT: 8 U/L (ref 0–44)
AST: 16 U/L (ref 15–41)
Albumin: 4.1 g/dL (ref 3.5–5.0)
Alkaline Phosphatase: 68 U/L (ref 38–126)
Anion gap: 6 (ref 5–15)
BUN: 22 mg/dL (ref 8–23)
CO2: 27 mmol/L (ref 22–32)
Calcium: 9 mg/dL (ref 8.9–10.3)
Chloride: 104 mmol/L (ref 98–111)
Creatinine: 1.05 mg/dL (ref 0.61–1.24)
GFR, Estimated: >60 mL/min (ref >60)
Glucose, Bld: 97 mg/dL (ref 70–99)
Potassium: 4 mmol/L (ref 3.5–5.1)
Sodium: 137 mmol/L (ref 135–145)
Total Bilirubin: 1 mg/dL (ref 0.0–1.2)
Total Protein: 6.9 g/dL (ref 6.5–8.1)

## 2024-02-09 NOTE — Progress Notes (Signed)
   Specialty Pharmacy Refill Coordination Note  Eric Riley is a 88 y.o. male contacted today regarding refills of specialty medication(s) Abiraterone  Acetate (ZYTIGA )   Patient requested (Patient-Rptd) Delivery   Delivery date: 02/21/24   Verified address: [address] [place]  [place] [address]   Medication will be filled on 02/18/24.   Cash Pricing $140.00

## 2024-02-10 DIAGNOSIS — E876 Hypokalemia: Secondary | ICD-10-CM | POA: Insufficient documentation

## 2024-02-10 LAB — TESTOSTERONE: Testosterone: <3 ng/dL — ABNORMAL LOW (ref 264–916)

## 2024-02-10 LAB — PROSTATE-SPECIFIC AG, SERUM (LABCORP): Prostate Specific Ag, Serum: <0.1 ng/mL (ref 0.0–4.0)

## 2024-02-10 NOTE — Progress Notes (Unsigned)
 [facility] OFFICE PROGRESS NOTE  Patient Care Team: Kip Righter, MD as PCP - General (Family Medicine)  Eric Riley is a 88 y.o.male with history of low back surgery, peripheral neuropathy, prostate cancer and osteoporosis being [facility] cancer. He had bilateral orchiectomy.    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 Somatic testing: not yet. Can be discussed in the future Treatment: AAP.  History of orchiectomy (06/05/2016) 06/2016 started AAP with complete response of undetectable PSA. 09/2020- mCRPC-Provenge   PSA continues to be <0.1. will continue monitor.  Assessment & Plan Hormone resistant prostate cancer (HCC) Continue AAP with ADT bilateral orchiectomy Monitor CMP, CBC, PSA and testosterone  Hypokalemia On Kcl 20 meq daily since started AAP No diarrhea At risk for side effect of medication Monitor for BP Will need to monitor liver dysfunction with abiraterone  Monitor for osteoporosis from prostate cancer treatment Monitor for signs of MI, stroke, severe hypertension from abiraterone . Aggressive cardiovascular risk management. Report lipid panel and DM labs being checked with PCP yearly in September Other osteoporosis without current pathological fracture 04/12/23 DXA Bone Density with osteoporosis  Continue Vit D3 and Calcium daily Continue weight bearing exercises  He has been on alendronate with endocrinology at [facility]. Neuropathy Chronic lower extremity neuropathy  Orders Placed This Encounter  Procedures   CBC with Differential ([facility] Only)    Standing Status:   Future    Expiration Date:   02/10/2025   CMP ([facility] only)    Standing Status:   Future    Expiration Date:   02/10/2025   Prostate-Specific AG, Serum    Standing Status:   Future    Expiration Date:   02/10/2025   Testosterone     Standing Status:   Future     Expiration Date:   02/10/2025     Eric JAYSON Chihuahua, MD  INTERVAL HISTORY: Patient returns for follow-up. No SE from AAP. Overall feeling well. Some muscle loss but able to exercise.  He think it was about 2007 diagnosis of very low risk T1cN0M0-Gleason 6(3+3)-PSA 4? prostate cancer proceed with surveillance He was living in [place] . He had a PSA which was about 4. He did have adenocarcinoma with a Gleason score of 6 (3+3) involving 4% of a core from the left lateral apex and 7% of a core from the left medial apex. The patient's father had had an indolent prostate cancer and ended up dying of dementia, so the patient decided that he did not want any treatment or further evaluation.   03/2016- [facility] consultation due to pain in the penis seeking evaluation.   04/22/2016 PET/CT reported metastatic disease to the bone. Result Impression 1. Multiple focal areas of abnormal radiotracer uptake within the axial and appendicular skeleton as above. Several of these lesions within the ribs, sacrum, and left temporal bone are indeterminate, and may represent osseous metastatic disease. Correlate with PSA levels. 2. Extensive degenerative and posttraumatic radiotracer uptake throughout the axial and appendicular skeleton. 3. Marked gallbladder distention without surrounding inflammatory change. Question hydrops. 4. Indeterminate 9 mm noncalcified nodules in the left upper lobe and right upper lobe as mentioned above. Recommend correlation with any outside prior examinations to establish stability. If none exist, follow-up chest CT is recommended in 3-6 months to establish stability. Etiologic possibilities include post infectious/inflammatory nodules, or neoplasm. 5. Multiple low-attenuation nodules within the inferior thyroid  lobes, suggesting multinodular goiter. 6. Atherosclerotic  disease with coronary calcifications.  05/2016 Bilateral orchiectomy performed on June 05, 2016. 06/2016  started AAP with complete response of undetectable PSA.   3/22- mCRPC-Provenge  therapy-continue Zytiga , prednisone , s/p bilateral orchiectomy 12/2020 PSA 0.3 elected for Provenge  02/01/21 CT AP Impression: No evidence of metastatic disease within the abdomen or pelvis.   10/22 TURP w/prostatic adenocarcinoma, Gleason score/pattern 4 + 4 = 8 (grade group 4), continue Zytiga , prednisone , s/p bilateral orchiectomy  MOLECULAR DIAGNOSTICS AND MARKERS  [facility] Diagnostic Testing Results Sample collection date: 02/09/2018 Report date: 03/05/2018 Test performed: Sequence analysis and deletion/duplication testing of the 87 genes listed: [facility] Multi-Cancer Panel & 7 individual genes. This diagnostic test evaluates the gene(s) for variants (genetic changes) that are associated with genetic disorders. NEGATIVE  This test did not identify any pathogenic variants, but includes at least one result that is not completely understood at this time. Clinical Summary A Variant of Uncertain Significance, c.1009C>T (p.Arg337Cys), was identified in ATM. The ATM gene is associated with autosomal dominant predisposition to breast, pancreatic and prostate cancers (PMID: 84071697, 84057374, 83001494, 77414832, 73516605, 73337821, 72566153, 72675011, 72010645) and autosomal recessive ataxia-telangiectasia (A-T) (MedGen UID: 439). A Variant of Uncertain Significance, c.431C>T (p.Pro144Leu), was identified in CDKN1C. The CDKN1C gene is associated with autosomal dominant Beckwith-Wiedemann syndrome (BWS) (MedGen UID: 2562) and IMAGe syndrome (intrauterine growth restriction, metaphyseal dysplasia, adrenal hypoplasia congenita, and genital anomalies) (MedGen UID: 662635).   04/15/23 bone scan: IMPRESSION: No evidence of osseous metastatic disease.   05/14/23 CT CAP 1. Scattered bilateral pulmonary nodules measuring up to 11 mm in the right upper lobe, nonspecific and could be infectious/inflammatory or metastatic. Consider  more definitive assessment by [facility] PET-CT. 2. Clustered areas of nodularity in the peripheral bilateral lower lobes, lingula and right middle lobe with mucoid impaction in the right middle lobe, likely infectious/inflammatory. Attention on follow-up imaging suggested. 3. Avidly enhancing heterogeneous nodule in the right lobe of the thyroid  measuring 2.5 cm. Consider further evaluation with ultrasound-guided FNA. 4. Wall thickening versus underdistention of the gastric antrum, correlate with symptoms of gastritis. 5. Mild wall thickening of a minimally distended urinary bladder, correlate with urinalysis to exclude cystitis. 6.  Aortic Atherosclerosis (ICD10-I70.0).   05/18/23 PSA 0.01   He reports no much side effects anymore from APP. He just ran out of prednisone .  PHYSICAL EXAMINATION: ECOG PERFORMANCE STATUS: 0 - Asymptomatic  Vitals:   02/11/24 1523  BP: (!) 144/77  Pulse: 65  Resp: 17  Temp: 97.7 F (36.5 C)  SpO2: 98%   Filed Weights   02/11/24 1523  Weight: 153 lb (69.4 kg)    GENERAL: alert, no distress and comfortable SKIN: skin color normal and no jaundice on exposed skin EYES: normal, sclera clear LUNGS: clear to auscultation and percussion with normal breathing effort HEART: regular rate & rhythm  ABDOMEN: abdomen soft, non-tender and nondistended. Musculoskeletal: no edema   Relevant data reviewed during this visit included labs. New labs ordered.

## 2024-02-10 NOTE — Assessment & Plan Note (Addendum)
 04/12/23 DXA Bone Density with osteoporosis  Continue Vit D3 and Calcium daily Continue weight bearing exercises  He has been switched to bisphosphonate with endocrinology at [facility].

## 2024-02-10 NOTE — Assessment & Plan Note (Addendum)
 Monitor for BP Will need to monitor liver dysfunction with abiraterone  Monitor for osteoporosis from prostate cancer treatment Monitor for signs of MI, stroke, severe hypertension from abiraterone . Aggressive cardiovascular risk management. Report lipid panel and DM labs being checked with PCP yearly in September

## 2024-02-10 NOTE — Assessment & Plan Note (Addendum)
 Continue AAP with ADT bilateral orchiectomy Monitor CMP, CBC, PSA and testosterone 

## 2024-02-11 ENCOUNTER — Inpatient Hospital Stay

## 2024-02-11 VITALS — BP 144/77 | HR 65 | Temp 97.7°F | Resp 17 | Ht 73.0 in | Wt 153.0 lb

## 2024-02-11 DIAGNOSIS — C61 Malignant neoplasm of prostate: Secondary | ICD-10-CM | POA: Diagnosis not present

## 2024-02-11 DIAGNOSIS — M818 Other osteoporosis without current pathological fracture: Secondary | ICD-10-CM | POA: Diagnosis not present

## 2024-02-11 DIAGNOSIS — Z192 Hormone resistant malignancy status: Secondary | ICD-10-CM

## 2024-02-11 DIAGNOSIS — E876 Hypokalemia: Secondary | ICD-10-CM

## 2024-02-11 DIAGNOSIS — Z9189 Other specified personal risk factors, not elsewhere classified: Secondary | ICD-10-CM

## 2024-02-11 DIAGNOSIS — C7951 Secondary malignant neoplasm of bone: Secondary | ICD-10-CM | POA: Diagnosis not present

## 2024-02-11 DIAGNOSIS — G629 Polyneuropathy, unspecified: Secondary | ICD-10-CM | POA: Diagnosis not present

## 2024-02-11 NOTE — Assessment & Plan Note (Addendum)
 Chronic lower extremity neuropathy

## 2024-02-11 NOTE — Assessment & Plan Note (Signed)
 On Kcl 20 meq daily since started AAP No diarrhea

## 2024-02-17 ENCOUNTER — Other Ambulatory Visit (HOSPITAL_COMMUNITY): Payer: Self-pay

## 2024-02-18 ENCOUNTER — Other Ambulatory Visit: Payer: Self-pay

## 2024-03-07 ENCOUNTER — Other Ambulatory Visit: Payer: Self-pay

## 2024-03-10 ENCOUNTER — Other Ambulatory Visit: Payer: Self-pay

## 2024-03-16 ENCOUNTER — Other Ambulatory Visit: Payer: Self-pay

## 2024-03-16 ENCOUNTER — Other Ambulatory Visit: Payer: Self-pay | Admitting: Pharmacy Technician

## 2024-03-16 NOTE — Progress Notes (Signed)
 Specialty Pharmacy Refill Coordination Note  Eric Riley is a 88 y.o. male contacted today regarding refills of specialty medication(s) Abiraterone  Acetate (ZYTIGA )   Patient requested Delivery   Delivery date: 03/23/24   Verified address: [address]   [place] [address]   Medication will be filled on 03/22/24.

## 2024-03-21 ENCOUNTER — Other Ambulatory Visit: Payer: Self-pay

## 2024-04-10 DIAGNOSIS — R7309 Other abnormal glucose: Secondary | ICD-10-CM | POA: Diagnosis not present

## 2024-04-10 DIAGNOSIS — E785 Hyperlipidemia, unspecified: Secondary | ICD-10-CM | POA: Diagnosis not present

## 2024-04-10 DIAGNOSIS — Z23 Encounter for immunization: Secondary | ICD-10-CM | POA: Diagnosis not present

## 2024-04-10 DIAGNOSIS — D649 Anemia, unspecified: Secondary | ICD-10-CM | POA: Diagnosis not present

## 2024-04-10 DIAGNOSIS — M8580 Other specified disorders of bone density and structure, unspecified site: Secondary | ICD-10-CM | POA: Diagnosis not present

## 2024-04-10 DIAGNOSIS — Z0000 Encounter for general adult medical examination without abnormal findings: Secondary | ICD-10-CM | POA: Diagnosis not present

## 2024-04-10 DIAGNOSIS — C61 Malignant neoplasm of prostate: Secondary | ICD-10-CM | POA: Diagnosis not present

## 2024-04-10 DIAGNOSIS — G629 Polyneuropathy, unspecified: Secondary | ICD-10-CM | POA: Diagnosis not present

## 2024-04-10 DIAGNOSIS — E538 Deficiency of other specified B group vitamins: Secondary | ICD-10-CM | POA: Diagnosis not present

## 2024-04-11 ENCOUNTER — Other Ambulatory Visit: Payer: Self-pay

## 2024-04-13 ENCOUNTER — Other Ambulatory Visit: Payer: Self-pay

## 2024-04-13 NOTE — Progress Notes (Signed)
 Specialty Pharmacy Refill Coordination Note  Eric Riley is a 88 y.o. male contacted today regarding refills of specialty medication(s) Abiraterone  Acetate (ZYTIGA )   Patient requested Delivery   Delivery date: 04/17/24   Verified address: [address]   [place] [address]   Medication will be filled on 04/14/2024.

## 2024-04-13 NOTE — Progress Notes (Signed)
 Specialty Pharmacy Ongoing Clinical Assessment Note  Eric Riley is a 88 y.o. male who is being followed by the specialty pharmacy service for [facility]   Patient's specialty medication(s) reviewed today: Abiraterone  Acetate (ZYTIGA )   Missed doses in the last 4 weeks: 0   Patient/Caregiver did not have any additional questions or concerns.   Therapeutic benefit summary: Patient is achieving benefit   Adverse events/side effects summary: No adverse events/side effects   Patient's therapy is appropriate to: Continue    Goals Addressed             This Visit's Progress    Maintain optimal adherence to therapy   On track    Patient is on track. Patient will maintain adherence         Follow up: 6 months  Powell CHRISTELLA Gallus Specialty Pharmacist

## 2024-04-14 ENCOUNTER — Other Ambulatory Visit: Payer: Self-pay

## 2024-05-04 ENCOUNTER — Other Ambulatory Visit: Payer: Self-pay

## 2024-05-09 ENCOUNTER — Inpatient Hospital Stay

## 2024-05-09 ENCOUNTER — Other Ambulatory Visit (HOSPITAL_COMMUNITY): Payer: Self-pay

## 2024-05-09 ENCOUNTER — Other Ambulatory Visit: Payer: Self-pay

## 2024-05-09 DIAGNOSIS — M818 Other osteoporosis without current pathological fracture: Secondary | ICD-10-CM | POA: Diagnosis not present

## 2024-05-09 DIAGNOSIS — Z9079 Acquired absence of other genital organ(s): Secondary | ICD-10-CM | POA: Insufficient documentation

## 2024-05-09 DIAGNOSIS — C7951 Secondary malignant neoplasm of bone: Secondary | ICD-10-CM | POA: Diagnosis not present

## 2024-05-09 DIAGNOSIS — C61 Malignant neoplasm of prostate: Secondary | ICD-10-CM | POA: Insufficient documentation

## 2024-05-09 LAB — CMP (CANCER CENTER ONLY)
ALT: 9 U/L (ref 0–44)
AST: 17 U/L (ref 15–41)
Albumin: 4.3 g/dL (ref 3.5–5.0)
Alkaline Phosphatase: 55 U/L (ref 38–126)
Anion gap: 5 (ref 5–15)
BUN: 26 mg/dL — ABNORMAL HIGH (ref 8–23)
CO2: 28 mmol/L (ref 22–32)
Calcium: 9.6 mg/dL (ref 8.9–10.3)
Chloride: 105 mmol/L (ref 98–111)
Creatinine: 1.02 mg/dL (ref 0.61–1.24)
GFR, Estimated: >60 mL/min (ref >60)
Glucose, Bld: 94 mg/dL (ref 70–99)
Potassium: 3.9 mmol/L (ref 3.5–5.1)
Sodium: 138 mmol/L (ref 135–145)
Total Bilirubin: 0.9 mg/dL (ref 0.0–1.2)
Total Protein: 6.7 g/dL (ref 6.5–8.1)

## 2024-05-09 LAB — CBC WITH DIFFERENTIAL (CANCER CENTER ONLY)
Abs Immature Granulocytes: 0.01 K/uL (ref 0.00–0.07)
Basophils Absolute: 0 K/uL (ref 0.0–0.1)
Basophils Relative: 1 %
Eosinophils Absolute: 0.1 K/uL (ref 0.0–0.5)
Eosinophils Relative: 2 %
HCT: 35.2 % — ABNORMAL LOW (ref 39.0–52.0)
Hemoglobin: 12 g/dL — ABNORMAL LOW (ref 13.0–17.0)
Immature Granulocytes: 0 %
Lymphocytes Relative: 30 %
Lymphs Abs: 1.7 K/uL (ref 0.7–4.0)
MCH: 31 pg (ref 26.0–34.0)
MCHC: 34.1 g/dL (ref 30.0–36.0)
MCV: 91 fL (ref 80.0–100.0)
Monocytes Absolute: 0.6 K/uL (ref 0.1–1.0)
Monocytes Relative: 10 %
Neutro Abs: 3.2 K/uL (ref 1.7–7.7)
Neutrophils Relative %: 57 %
Platelet Count: 261 K/uL (ref 150–400)
RBC: 3.87 MIL/uL — ABNORMAL LOW (ref 4.22–5.81)
RDW: 12.6 % (ref 11.5–15.5)
WBC Count: 5.7 K/uL (ref 4.0–10.5)
nRBC: 0 % (ref 0.0–0.2)

## 2024-05-09 NOTE — Progress Notes (Signed)
 Specialty Pharmacy Refill Coordination Note  Eric Riley is a 88 y.o. male contacted today regarding refills of specialty medication(s) Abiraterone  Acetate (ZYTIGA )   Patient requested Delivery   Delivery date: 05/12/24   Verified address: [address]   [place] [address]   Medication will be filled on 05/11/24.

## 2024-05-10 ENCOUNTER — Other Ambulatory Visit: Payer: Self-pay

## 2024-05-10 LAB — TESTOSTERONE: Testosterone: <3 ng/dL — ABNORMAL LOW (ref 264–916)

## 2024-05-10 LAB — PROSTATE-SPECIFIC AG, SERUM (LABCORP): Prostate Specific Ag, Serum: <0.1 ng/mL (ref 0.0–4.0)

## 2024-05-11 ENCOUNTER — Ambulatory Visit: Payer: Self-pay

## 2024-05-11 NOTE — Progress Notes (Signed)
 [facility] OFFICE PROGRESS NOTE  Patient Care Team: Kip Righter, MD as PCP - General (Family Medicine)  Eric Riley is a 88 y.o.male with history of low back surgery, peripheral neuropathy, prostate cancer and osteoporosis being [facility].  He had bilateral orchiectomy.    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 Somatic testing: not yet. Can be discussed in the future Treatment: AAP.  History of orchiectomy (06/05/2016) 06/2016 started AAP with complete response of undetectable PSA. 09/2020- mCRPC-Provenge    PSA continues to be <0.1. will continue monitor.  He tolerates AAP very well.  He has no side effects.  He is following with endocrinology as well for osteoporosis.  Will continue to monitor. Assessment & Plan Hormone resistant prostate cancer (HCC) Continue AAP with ADT bilateral orchiectomy Monitor CMP, CBC, PSA and testosterone  At risk for side effect of medication Monitor for BP Will need to monitor liver dysfunction with abiraterone  Monitor for osteoporosis from prostate cancer treatment Monitor for signs of MI, stroke, severe hypertension from abiraterone . Aggressive cardiovascular risk management. Report lipid panel and DM labs being checked with PCP yearly in September Other osteoporosis without current pathological fracture 04/12/23 DXA Bone Density with osteoporosis  Continue Vit D3 and Calcium daily Continue weight bearing exercises  He has been on alendronate with endocrinology at [facility]. (Dr. Faythe)  Orders Placed This Encounter  Procedures   CBC with Differential (Cancer Center Only)    Standing Status:   Future    Expiration Date:   05/12/2025   CMP (Cancer Center only)    Standing Status:   Future    Expiration Date:   05/12/2025   Testosterone     Standing Status:   Future    Expiration Date:   05/12/2025   PSA     Standing Status:   Future    Expiration Date:   05/12/2025     Eric JAYSON Chihuahua, MD  INTERVAL HISTORY: Patient returns for follow-up.  Overall feeling well.  He is tolerating abiraterone  with prednisone .  He denies any side effects.  No new bone pain or back pain.  No chest pain.  He is taking care of his wife who has dementia.  Oncology History  Hormone resistant prostate cancer (HCC)  07/28/2023 Initial Diagnosis   He think it was about 2007 diagnosis of very low risk T1cN0M0-Gleason 6(3+3)-PSA 4? prostate cancer proceed with surveillance He was living in [place] . He had a PSA which was about 4. He did have adenocarcinoma with a Gleason score of 6 (3+3) involving 4% of a core from the left lateral apex and 7% of a core from the left medial apex. The patient's father had had an indolent prostate cancer and ended up dying of dementia, so the patient decided that he did not want any treatment or further evaluation.   03/2016- [facility] consultation due to pain in the penis seeking evaluation.   04/22/2016 PET/CT reported metastatic disease to the bone. Result Impression 1. Multiple focal areas of abnormal radiotracer uptake within the axial and appendicular skeleton as above. Several of these lesions within the ribs, sacrum, and left temporal bone are indeterminate, and may represent osseous metastatic disease. Correlate with PSA levels. 2. Extensive degenerative and posttraumatic radiotracer uptake throughout the axial and appendicular skeleton. 3. Marked gallbladder distention without surrounding inflammatory change. Question hydrops. 4. Indeterminate 9 mm noncalcified nodules in the left upper lobe and  right upper lobe as mentioned above. Recommend correlation with any outside prior examinations to establish stability. If none exist, follow-up chest CT is recommended in 3-6 months to establish stability. Etiologic possibilities include post infectious/inflammatory nodules, or  neoplasm. 5. Multiple low-attenuation nodules within the inferior thyroid  lobes, suggesting multinodular goiter. 6. Atherosclerotic disease with coronary calcifications.  05/2016 Bilateral orchiectomy performed on June 05, 2016. 06/2016 started AAP with complete response of undetectable PSA.   3/22- mCRPC-Provenge  therapy-continue Zytiga , prednisone , s/p bilateral orchiectomy 12/2020 PSA 0.3 elected for Provenge  02/01/21 CT AP Impression: No evidence of metastatic disease within the abdomen or pelvis.   10/22 TURP w/prostatic adenocarcinoma, Gleason score/pattern 4 + 4 = 8 (grade group 4), continue Zytiga , prednisone , s/p bilateral orchiectomy  MOLECULAR DIAGNOSTICS AND MARKERS  [facility] Diagnostic Testing Results Sample collection date: 02/09/2018 Report date: 03/05/2018 Test performed: Sequence analysis and deletion/duplication testing of the 87 genes listed: [employer] Multi-Cancer Panel & 7 individual genes. This diagnostic test evaluates the gene(s) for variants (genetic changes) that are associated with genetic disorders. NEGATIVE  This test did not identify any pathogenic variants, but includes at least one result that is not completely understood at this time. Clinical Summary A Variant of Uncertain Significance, c.1009C>T (p.Arg337Cys), was identified in ATM. The ATM gene is associated with autosomal dominant predisposition to breast, pancreatic and prostate cancers (PMID: 84071697, 84057374, 83001494, 77414832, 73516605, 73337821, 72566153, 72675011, 72010645) and autosomal recessive ataxia-telangiectasia (A-T) (MedGen UID: 439). A Variant of Uncertain Significance, c.431C>T (p.Pro144Leu), was identified in CDKN1C. The CDKN1C gene is associated with autosomal dominant Beckwith-Wiedemann syndrome (BWS) (MedGen UID: 2562) and IMAGe syndrome (intrauterine growth restriction, metaphyseal dysplasia, adrenal hypoplasia congenita, and genital anomalies) (MedGen UID: 662635).   04/15/23 bone  scan: IMPRESSION: No evidence of osseous metastatic disease.   05/14/23 CT CAP 1. Scattered bilateral pulmonary nodules measuring up to 11 mm in the right upper lobe, nonspecific and could be infectious/inflammatory or metastatic. Consider more definitive assessment by PMSA PET-CT. 2. Clustered areas of nodularity in the peripheral bilateral lower lobes, lingula and right middle lobe with mucoid impaction in the right middle lobe, likely infectious/inflammatory. Attention on follow-up imaging suggested. 3. Avidly enhancing heterogeneous nodule in the right lobe of the thyroid  measuring 2.5 cm. Consider further evaluation with ultrasound-guided FNA. 4. Wall thickening versus underdistention of the gastric antrum, correlate with symptoms of gastritis. 5. Mild wall thickening of a minimally distended urinary bladder, correlate with urinalysis to exclude cystitis. 6.  Aortic Atherosclerosis (ICD10-I70.0).   05/18/23 PSA 0.01  07/29/23 first visit to [facility]      PHYSICAL EXAMINATION: ECOG PERFORMANCE STATUS: 0 - Asymptomatic  Vitals:   05/12/24 1320  BP: 122/64  Pulse: 72  Resp: 20  Temp: (!) 97 F (36.1 C)  SpO2: 99%   Filed Weights   05/12/24 1320  Weight: 152 lb 14.4 oz (69.4 kg)    GENERAL: alert, no distress and comfortable SKIN: skin color normal and no jaundice  LUNGS: clear to auscultation and no wheeze or rales with normal breathing effort HEART: regular rate & rhythm  ABDOMEN: abdomen soft, non-tender and nondistended. Musculoskeletal: no edema  Relevant data reviewed during this visit included labs.  New labs ordered.

## 2024-05-12 ENCOUNTER — Inpatient Hospital Stay

## 2024-05-12 VITALS — BP 122/64 | HR 72 | Temp 97.0°F | Resp 20 | Wt 152.9 lb

## 2024-05-12 DIAGNOSIS — C7951 Secondary malignant neoplasm of bone: Secondary | ICD-10-CM | POA: Diagnosis not present

## 2024-05-12 DIAGNOSIS — C61 Malignant neoplasm of prostate: Secondary | ICD-10-CM

## 2024-05-12 DIAGNOSIS — Z9189 Other specified personal risk factors, not elsewhere classified: Secondary | ICD-10-CM

## 2024-05-12 DIAGNOSIS — Z192 Hormone resistant malignancy status: Secondary | ICD-10-CM | POA: Diagnosis not present

## 2024-05-12 DIAGNOSIS — Z9079 Acquired absence of other genital organ(s): Secondary | ICD-10-CM | POA: Diagnosis not present

## 2024-05-12 DIAGNOSIS — M818 Other osteoporosis without current pathological fracture: Secondary | ICD-10-CM | POA: Diagnosis not present

## 2024-05-12 NOTE — Assessment & Plan Note (Addendum)
 04/12/23 DXA Bone Density with osteoporosis  Continue Vit D3 and Calcium daily Continue weight bearing exercises  He has been on alendronate with endocrinology at [facility]. (Dr. Faythe)

## 2024-05-12 NOTE — Assessment & Plan Note (Addendum)
 Monitor for BP Will need to monitor liver dysfunction with abiraterone  Monitor for osteoporosis from prostate cancer treatment Monitor for signs of MI, stroke, severe hypertension from abiraterone . Aggressive cardiovascular risk management. Report lipid panel and DM labs being checked with PCP yearly in September

## 2024-05-12 NOTE — Assessment & Plan Note (Addendum)
 Continue AAP with ADT bilateral orchiectomy Monitor CMP, CBC, PSA and testosterone 

## 2024-06-01 ENCOUNTER — Other Ambulatory Visit (HOSPITAL_COMMUNITY): Payer: Self-pay

## 2024-06-01 ENCOUNTER — Other Ambulatory Visit: Payer: Self-pay

## 2024-06-05 ENCOUNTER — Other Ambulatory Visit: Payer: Self-pay

## 2024-06-05 NOTE — Progress Notes (Signed)
 Specialty Pharmacy Refill Coordination Note  Eric Riley is a 88 y.o. male contacted today regarding refills of specialty medication(s) Abiraterone  Acetate (ZYTIGA )   Patient requested Delivery   Delivery date: 06/15/24   Verified address: [address]   [place] [address]   Medication will be filled on: 06/14/24

## 2024-06-14 ENCOUNTER — Other Ambulatory Visit: Payer: Self-pay

## 2024-07-06 ENCOUNTER — Other Ambulatory Visit (HOSPITAL_COMMUNITY): Payer: Self-pay

## 2024-07-06 ENCOUNTER — Other Ambulatory Visit: Payer: Self-pay

## 2024-07-06 MED ORDER — ABIRATERONE ACETATE 250 MG PO TABS
1000.0000 mg | ORAL_TABLET | Freq: Every day | ORAL | 9 refills | Status: AC
  Filled 2024-07-06 – 2024-07-14 (×3): qty 120, 30d supply, fill #0
  Filled 2024-08-11: qty 120, 30d supply, fill #1

## 2024-07-07 ENCOUNTER — Other Ambulatory Visit: Payer: Self-pay

## 2024-07-07 ENCOUNTER — Other Ambulatory Visit (HOSPITAL_COMMUNITY): Payer: Self-pay

## 2024-07-11 ENCOUNTER — Other Ambulatory Visit: Payer: Self-pay

## 2024-07-13 ENCOUNTER — Other Ambulatory Visit: Payer: Self-pay

## 2024-07-14 ENCOUNTER — Other Ambulatory Visit: Payer: Self-pay

## 2024-07-14 ENCOUNTER — Other Ambulatory Visit: Payer: Self-pay | Admitting: Pharmacy Technician

## 2024-07-14 NOTE — Progress Notes (Signed)
 Specialty Pharmacy Refill Coordination Note  Eric Riley is a 88 y.o. male contacted today regarding refills of specialty medication(s) Abiraterone  Acetate (ZYTIGA )   Patient requested Delivery   Delivery date: 07/21/24   Verified address: [address]  [place]   Medication will be filled on: 07/19/24

## 2024-07-19 ENCOUNTER — Other Ambulatory Visit: Payer: Self-pay

## 2024-08-11 ENCOUNTER — Other Ambulatory Visit (HOSPITAL_COMMUNITY): Payer: Self-pay

## 2024-08-14 ENCOUNTER — Inpatient Hospital Stay

## 2024-08-14 DIAGNOSIS — Z9079 Acquired absence of other genital organ(s): Secondary | ICD-10-CM | POA: Diagnosis not present

## 2024-08-14 DIAGNOSIS — C61 Malignant neoplasm of prostate: Secondary | ICD-10-CM | POA: Insufficient documentation

## 2024-08-14 DIAGNOSIS — C7951 Secondary malignant neoplasm of bone: Secondary | ICD-10-CM | POA: Diagnosis present

## 2024-08-14 DIAGNOSIS — Z79818 Long term (current) use of other agents affecting estrogen receptors and estrogen levels: Secondary | ICD-10-CM | POA: Diagnosis not present

## 2024-08-14 LAB — CBC WITH DIFFERENTIAL (CANCER CENTER ONLY)
Abs Immature Granulocytes: 0.01 K/uL (ref 0.00–0.07)
Basophils Absolute: 0 K/uL (ref 0.0–0.1)
Basophils Relative: 1 %
Eosinophils Absolute: 0.1 K/uL (ref 0.0–0.5)
Eosinophils Relative: 2 %
HCT: 37.8 % — ABNORMAL LOW (ref 39.0–52.0)
Hemoglobin: 12.7 g/dL — ABNORMAL LOW (ref 13.0–17.0)
Immature Granulocytes: 0 %
Lymphocytes Relative: 26 %
Lymphs Abs: 1.3 K/uL (ref 0.7–4.0)
MCH: 31.2 pg (ref 26.0–34.0)
MCHC: 33.6 g/dL (ref 30.0–36.0)
MCV: 92.9 fL (ref 80.0–100.0)
Monocytes Absolute: 0.5 K/uL (ref 0.1–1.0)
Monocytes Relative: 10 %
Neutro Abs: 3.2 K/uL (ref 1.7–7.7)
Neutrophils Relative %: 61 %
Platelet Count: 251 K/uL (ref 150–400)
RBC: 4.07 MIL/uL — ABNORMAL LOW (ref 4.22–5.81)
RDW: 12.6 % (ref 11.5–15.5)
WBC Count: 5.2 K/uL (ref 4.0–10.5)
nRBC: 0 % (ref 0.0–0.2)

## 2024-08-14 LAB — PSA: Prostatic Specific Antigen: <0.02 ng/mL (ref 0.00–4.00)

## 2024-08-14 LAB — CMP (CANCER CENTER ONLY)
ALT: 8 U/L (ref 0–44)
AST: 20 U/L (ref 15–41)
Albumin: 4.3 g/dL (ref 3.5–5.0)
Alkaline Phosphatase: 70 U/L (ref 38–126)
Anion gap: 10 (ref 5–15)
BUN: 22 mg/dL (ref 8–23)
CO2: 26 mmol/L (ref 22–32)
Calcium: 9.1 mg/dL (ref 8.9–10.3)
Chloride: 107 mmol/L (ref 98–111)
Creatinine: 1.13 mg/dL (ref 0.61–1.24)
GFR, Estimated: >60 mL/min
Glucose, Bld: 75 mg/dL (ref 70–99)
Potassium: 3.4 mmol/L — ABNORMAL LOW (ref 3.5–5.1)
Sodium: 144 mmol/L (ref 135–145)
Total Bilirubin: 1.1 mg/dL (ref 0.0–1.2)
Total Protein: 6.9 g/dL (ref 6.5–8.1)

## 2024-08-15 ENCOUNTER — Other Ambulatory Visit: Payer: Self-pay

## 2024-08-15 LAB — TESTOSTERONE: Testosterone: <3 ng/dL — ABNORMAL LOW (ref 264–916)

## 2024-08-15 NOTE — Progress Notes (Signed)
 Specialty Pharmacy Refill Coordination Note  Eric Riley is a 89 y.o. male contacted today regarding refills of specialty medication(s) Abiraterone  Acetate (ZYTIGA )   Patient requested Delivery   Delivery date: 08/18/24   Verified address: [address]  [place]   Medication will be filled on: 08/17/24

## 2024-08-16 NOTE — Progress Notes (Unsigned)
 [facility] OFFICE PROGRESS NOTE  Patient Care Team: Kip Righter, MD as PCP - General (Family Medicine)  Jorma is a 89 y.o.male with history of low back surgery, peripheral neuropathy, prostate cancer and osteoporosis being [facility] for [facility] cancer.  He had bilateral orchiectomy.    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 Somatic testing: not yet. Can be discussed in the future Treatment: AAP.  History of orchiectomy (06/05/2016) 06/2016 started AAP with complete response of undetectable PSA. 09/2020- [facility]PC-Provenge    PSA continues to be <0.1. will continue monitor.  Assessment & Plan   No orders of the defined types were placed in this encounter.    [name], MD  INTERVAL HISTORY: Patient returns for follow-up.  Oncology History  Hormone resistant prostate cancer (HCC)  07/28/2023 Initial Diagnosis   He think it was about 2007 diagnosis of very low risk T1cN0M0-Gleason 6(3+3)-PSA 4? prostate cancer proceed with surveillance He was living in [place] . He had a PSA which was about 4. He did have adenocarcinoma with a Gleason score of 6 (3+3) involving 4% of a core from the left lateral apex and 7% of a core from the left medial apex. The patient's father had had an indolent prostate cancer and ended up dying of dementia, so the patient decided that he did not want any treatment or further evaluation.   03/2016- [facility] consultation due to pain in the penis seeking evaluation.   04/22/2016 PET/CT reported metastatic disease to the bone. Result Impression 1. Multiple focal areas of abnormal radiotracer uptake within the axial and appendicular skeleton as above. Several of these lesions within the ribs, sacrum, and left temporal bone are indeterminate, and may represent osseous metastatic disease. Correlate with PSA levels. 2. Extensive  degenerative and posttraumatic radiotracer uptake throughout the axial and appendicular skeleton. 3. Marked gallbladder distention without surrounding inflammatory change. Question hydrops. 4. Indeterminate 9 mm noncalcified nodules in the left upper lobe and right upper lobe as mentioned above. Recommend correlation with any outside prior examinations to establish stability. If none exist, follow-up chest CT is recommended in 3-6 months to establish stability. Etiologic possibilities include post infectious/inflammatory nodules, or neoplasm. 5. Multiple low-attenuation nodules within the inferior thyroid  lobes, suggesting multinodular goiter. 6. Atherosclerotic disease with coronary calcifications.  05/2016 Bilateral orchiectomy performed on June 05, 2016. 06/2016 started AAP with complete response of undetectable PSA.   3/22- m[facility]PC-Provenge  therapy-continue Zytiga , prednisone , s/p bilateral orchiectomy 12/2020 PSA 0.3 elected for Provenge  02/01/21 CT AP Impression: No evidence of metastatic disease within the abdomen or pelvis.   10/22 TURP w/prostatic adenocarcinoma, Gleason score/pattern 4 + 4 = 8 (grade group 4), continue Zytiga , prednisone , s/p bilateral orchiectomy  MOLECULAR DIAGNOSTICS AND MARKERS  [facility] Diagnostic Testing Results Sample collection date: 02/09/2018 Report date: 03/05/2018 Test performed: Sequence analysis and deletion/duplication testing of the 87 genes listed: [employer] Multi-Cancer Panel & 7 individual genes. This diagnostic test evaluates the gene(s) for variants (genetic changes) that are associated with genetic disorders. NEGATIVE  This test did not identify any pathogenic variants, but includes at least one result that is not completely understood at this time. Clinical Summary A Variant of Uncertain Significance, c.1009C>T (p.Arg337Cys), was identified in ATM. The ATM gene is associated with autosomal dominant predisposition to breast, pancreatic and prostate  cancers (PMID: 84071697, 84057374, 83001494, 77414832, 73516605, 73337821, 72566153, 72675011, 72010645) and autosomal recessive ataxia-telangiectasia (A-T) (MedGen UID: 439).  A Variant of Uncertain Significance, c.431C>T (p.Pro144Leu), was identified in CDKN1C. The CDKN1C gene is associated with autosomal dominant Beckwith-Wiedemann syndrome (BWS) (MedGen UID: 2562) and IMAGe syndrome (intrauterine growth restriction, metaphyseal dysplasia, adrenal hypoplasia congenita, and genital anomalies) (MedGen UID: 662635).   04/15/23 bone scan: IMPRESSION: No evidence of osseous metastatic disease.   05/14/23 CT CAP 1. Scattered bilateral pulmonary nodules measuring up to 11 mm in the right upper lobe, nonspecific and could be infectious/inflammatory or metastatic. Consider more definitive assessment by PMSA PET-CT. 2. Clustered areas of nodularity in the peripheral bilateral lower lobes, lingula and right middle lobe with mucoid impaction in the right middle lobe, likely infectious/inflammatory. Attention on follow-up imaging suggested. 3. Avidly enhancing heterogeneous nodule in the right lobe of the thyroid  measuring 2.5 cm. Consider further evaluation with ultrasound-guided FNA. 4. Wall thickening versus underdistention of the gastric antrum, correlate with symptoms of gastritis. 5. Mild wall thickening of a minimally distended urinary bladder, correlate with urinalysis to exclude cystitis. 6.  Aortic Atherosclerosis (ICD10-I70.0).   05/18/23 PSA 0.01  07/29/23 first visit to [facility]      PHYSICAL EXAMINATION: ECOG PERFORMANCE STATUS: {CHL ONC ECOG [phone]}  There were no vitals filed for this visit. There were no vitals filed for this visit.  GENERAL: alert, no distress and comfortable SKIN: skin color normal and no jaundice or bruising or petechiae on exposed skin EYES: normal, sclera clear OROPHARYNX: no exudate  NECK: No palpable mass LYMPH:  no palpable  cervical, axillary lymphadenopathy  LUNGS: clear to auscultation and no wheeze or rales with normal breathing effort HEART: regular rate & rhythm  ABDOMEN: abdomen soft, non-tender and nondistended. Musculoskeletal: no edema NEURO: no focal motor/sensory deficits  Relevant data reviewed during this visit included labs.  New labs ordered.

## 2024-08-16 NOTE — Assessment & Plan Note (Signed)
 Continue AAP with ADT bilateral orchiectomy Monitor CMP, CBC, PSA and testosterone 

## 2024-08-16 NOTE — Assessment & Plan Note (Signed)
 04/12/23 DXA Bone Density with osteoporosis  Continue Vit D3 and Calcium daily Continue weight bearing exercises  He has been on alendronate with endocrinology at [facility]. (Dr. Faythe)

## 2024-08-16 NOTE — Assessment & Plan Note (Signed)
 Monitor for BP Will need to monitor liver dysfunction with abiraterone  Monitor for osteoporosis from prostate cancer treatment Monitor for signs of MI, stroke, severe hypertension from abiraterone . Aggressive cardiovascular risk management. Report lipid panel and DM labs being checked with PCP yearly in September

## 2024-08-16 NOTE — Assessment & Plan Note (Signed)
 On Kcl 20 meq daily since started AAP No diarrhea

## 2024-08-17 ENCOUNTER — Other Ambulatory Visit: Payer: Self-pay

## 2024-08-17 ENCOUNTER — Inpatient Hospital Stay

## 2024-08-17 DIAGNOSIS — E876 Hypokalemia: Secondary | ICD-10-CM

## 2024-08-17 DIAGNOSIS — M818 Other osteoporosis without current pathological fracture: Secondary | ICD-10-CM

## 2024-08-17 DIAGNOSIS — C61 Malignant neoplasm of prostate: Secondary | ICD-10-CM

## 2024-08-17 DIAGNOSIS — Z9189 Other specified personal risk factors, not elsewhere classified: Secondary | ICD-10-CM

## 2024-08-18 ENCOUNTER — Other Ambulatory Visit: Payer: Self-pay

## 2024-08-18 DIAGNOSIS — C61 Malignant neoplasm of prostate: Secondary | ICD-10-CM

## 2024-08-22 ENCOUNTER — Other Ambulatory Visit: Payer: Self-pay

## 2024-08-22 ENCOUNTER — Telehealth: Payer: Self-pay

## 2024-08-22 DIAGNOSIS — C61 Malignant neoplasm of prostate: Secondary | ICD-10-CM

## 2024-08-22 NOTE — Telephone Encounter (Signed)
 Scheduled patient for next appt. Called and spoke with the patient, he is aware.

## 2024-09-11 ENCOUNTER — Inpatient Hospital Stay

## 2024-09-28 ENCOUNTER — Inpatient Hospital Stay

## 2024-11-14 ENCOUNTER — Inpatient Hospital Stay

## 2024-11-16 ENCOUNTER — Inpatient Hospital Stay
